# Patient Record
Sex: Male | Born: 1945
Health system: Southern US, Community
[De-identification: ages and names within clinical notes are randomized; demographics above are authoritative.]

## PROBLEM LIST (undated history)

## (undated) DIAGNOSIS — C61 Malignant neoplasm of prostate: Secondary | ICD-10-CM

## (undated) DIAGNOSIS — M2550 Pain in unspecified joint: Secondary | ICD-10-CM

## (undated) DIAGNOSIS — I10 Essential (primary) hypertension: Secondary | ICD-10-CM

---

## 2000-08-08 ENCOUNTER — Ambulatory Visit (HOSPITAL_COMMUNITY): Admission: RE | Admit: 2000-08-08 | Discharge: 2000-08-08 | Payer: Self-pay | Admitting: Internal Medicine

## 2000-08-08 ENCOUNTER — Encounter: Payer: Self-pay | Admitting: Internal Medicine

## 2003-02-13 ENCOUNTER — Ambulatory Visit (HOSPITAL_COMMUNITY): Admission: RE | Admit: 2003-02-13 | Discharge: 2003-02-13 | Payer: Self-pay | Admitting: Gastroenterology

## 2010-09-14 ENCOUNTER — Other Ambulatory Visit: Payer: Self-pay | Admitting: Gastroenterology

## 2011-03-09 ENCOUNTER — Ambulatory Visit
Admission: RE | Admit: 2011-03-09 | Discharge: 2011-03-09 | Disposition: A | Discharge status: Home / Self Care | Payer: BC Managed Care – PPO | Source: Ambulatory Visit | Attending: Radiation Oncology | Admitting: Radiation Oncology

## 2011-03-09 DIAGNOSIS — Z79899 Other long term (current) drug therapy: Secondary | ICD-10-CM | POA: Insufficient documentation

## 2011-03-09 DIAGNOSIS — I10 Essential (primary) hypertension: Secondary | ICD-10-CM | POA: Insufficient documentation

## 2011-03-09 DIAGNOSIS — Z87891 Personal history of nicotine dependence: Secondary | ICD-10-CM | POA: Insufficient documentation

## 2011-03-09 DIAGNOSIS — C61 Malignant neoplasm of prostate: Secondary | ICD-10-CM | POA: Insufficient documentation

## 2011-03-09 DIAGNOSIS — E119 Type 2 diabetes mellitus without complications: Secondary | ICD-10-CM | POA: Insufficient documentation

## 2011-03-09 DIAGNOSIS — N529 Male erectile dysfunction, unspecified: Secondary | ICD-10-CM | POA: Insufficient documentation

## 2011-03-09 DIAGNOSIS — E7800 Pure hypercholesterolemia, unspecified: Secondary | ICD-10-CM | POA: Insufficient documentation

## 2011-03-09 DIAGNOSIS — Z803 Family history of malignant neoplasm of breast: Secondary | ICD-10-CM | POA: Insufficient documentation

## 2011-03-09 DIAGNOSIS — Z808 Family history of malignant neoplasm of other organs or systems: Secondary | ICD-10-CM | POA: Insufficient documentation

## 2011-03-23 ENCOUNTER — Ambulatory Visit (HOSPITAL_BASED_OUTPATIENT_CLINIC_OR_DEPARTMENT_OTHER)
Admission: RE | Admit: 2011-03-23 | Discharge: 2011-03-23 | Disposition: A | Discharge status: Home / Self Care | Payer: BC Managed Care – PPO | Source: Ambulatory Visit | Attending: Urology | Admitting: Urology

## 2011-03-23 ENCOUNTER — Other Ambulatory Visit: Payer: Self-pay | Admitting: Urology

## 2011-03-23 ENCOUNTER — Ambulatory Visit
Admission: RE | Admit: 2011-03-23 | Discharge: 2011-03-23 | Disposition: A | Discharge status: Home / Self Care | Payer: BC Managed Care – PPO | Source: Ambulatory Visit | Attending: Urology | Admitting: Urology

## 2011-03-23 DIAGNOSIS — Z01811 Encounter for preprocedural respiratory examination: Secondary | ICD-10-CM

## 2011-03-23 DIAGNOSIS — C61 Malignant neoplasm of prostate: Secondary | ICD-10-CM | POA: Insufficient documentation

## 2011-03-31 ENCOUNTER — Other Ambulatory Visit: Payer: Self-pay | Admitting: Urology

## 2011-04-07 ENCOUNTER — Telehealth: Payer: Self-pay | Admitting: Radiation Oncology

## 2011-04-07 NOTE — Telephone Encounter (Signed)
No Pre Cert req'd for implant rad tx. I spoke with Mervyn Gay at [facility] - Call id nbr [phone]  Cpt's Prostate Implant [id number]    [phone]    [phone]    [id number] [phone]    [phone]

## 2011-04-12 ENCOUNTER — Encounter (HOSPITAL_BASED_OUTPATIENT_CLINIC_OR_DEPARTMENT_OTHER): Payer: Self-pay | Admitting: *Deleted

## 2011-04-12 LAB — COMPREHENSIVE METABOLIC PANEL
AST: 13 U/L (ref 0–37)
Albumin: 3.7 g/dL (ref 3.5–5.2)
Calcium: 9.8 mg/dL (ref 8.4–10.5)
Chloride: 96 mEq/L (ref 96–112)
Creatinine, Ser: 0.97 mg/dL (ref 0.50–1.35)
Total Bilirubin: 0.3 mg/dL (ref 0.3–1.2)
Total Protein: 6.8 g/dL (ref 6.0–8.3)

## 2011-04-12 LAB — CBC
MCHC: 33 g/dL (ref 30.0–36.0)
MCV: 85.7 fL (ref 78.0–100.0)
Platelets: 290 10*3/uL (ref 150–400)
RDW: 12.9 % (ref 11.5–15.5)
WBC: 7.5 10*3/uL (ref 4.0–10.5)

## 2011-04-12 LAB — PROTIME-INR
INR: 0.9 (ref 0.00–1.49)
Prothrombin Time: 12.3 seconds (ref 11.6–15.2)

## 2011-04-12 LAB — APTT: aPTT: 34 seconds (ref 24–37)

## 2011-04-12 NOTE — Progress Notes (Signed)
NPO AFTER MN. PT TO ARRIVE AT 0815. EKG AND CXR W/ CHART. LABS TO DONE AT [facility] TODAY. WILL DO FLEET ENEMA AM OF SURG.

## 2011-04-13 ENCOUNTER — Telehealth: Payer: Self-pay | Admitting: *Deleted

## 2011-04-14 ENCOUNTER — Encounter (HOSPITAL_BASED_OUTPATIENT_CLINIC_OR_DEPARTMENT_OTHER): Payer: Self-pay | Admitting: Anesthesiology

## 2011-04-14 ENCOUNTER — Encounter: Payer: BC Managed Care – PPO | Admitting: Radiation Oncology

## 2011-04-14 ENCOUNTER — Encounter: Payer: Self-pay | Admitting: Radiation Oncology

## 2011-04-14 ENCOUNTER — Ambulatory Visit (HOSPITAL_BASED_OUTPATIENT_CLINIC_OR_DEPARTMENT_OTHER)
Admission: RE | Admit: 2011-04-14 | Discharge: 2011-04-14 | Disposition: A | Discharge status: Home / Self Care | Payer: BC Managed Care – PPO | Source: Ambulatory Visit | Attending: Urology | Admitting: Urology

## 2011-04-14 ENCOUNTER — Encounter (HOSPITAL_BASED_OUTPATIENT_CLINIC_OR_DEPARTMENT_OTHER)
Admission: RE | Disposition: A | Discharge status: Home / Self Care | Payer: Self-pay | Source: Ambulatory Visit | Attending: Urology

## 2011-04-14 ENCOUNTER — Ambulatory Visit (HOSPITAL_COMMUNITY): Payer: BC Managed Care – PPO | Attending: Urology

## 2011-04-14 DIAGNOSIS — Z79899 Other long term (current) drug therapy: Secondary | ICD-10-CM | POA: Insufficient documentation

## 2011-04-14 DIAGNOSIS — E7800 Pure hypercholesterolemia, unspecified: Secondary | ICD-10-CM | POA: Insufficient documentation

## 2011-04-14 DIAGNOSIS — I10 Essential (primary) hypertension: Secondary | ICD-10-CM | POA: Insufficient documentation

## 2011-04-14 DIAGNOSIS — N433 Hydrocele, unspecified: Secondary | ICD-10-CM | POA: Insufficient documentation

## 2011-04-14 DIAGNOSIS — L909 Atrophic disorder of skin, unspecified: Secondary | ICD-10-CM | POA: Insufficient documentation

## 2011-04-14 DIAGNOSIS — C61 Malignant neoplasm of prostate: Secondary | ICD-10-CM | POA: Insufficient documentation

## 2011-04-14 DIAGNOSIS — L919 Hypertrophic disorder of the skin, unspecified: Secondary | ICD-10-CM | POA: Insufficient documentation

## 2011-04-14 DIAGNOSIS — E119 Type 2 diabetes mellitus without complications: Secondary | ICD-10-CM | POA: Insufficient documentation

## 2011-04-14 DIAGNOSIS — N529 Male erectile dysfunction, unspecified: Secondary | ICD-10-CM | POA: Insufficient documentation

## 2011-04-14 DIAGNOSIS — N400 Enlarged prostate without lower urinary tract symptoms: Secondary | ICD-10-CM | POA: Insufficient documentation

## 2011-04-14 HISTORY — DX: Essential (primary) hypertension: I10

## 2011-04-14 HISTORY — PX: RADIOACTIVE SEED IMPLANT: SHX5150

## 2011-04-14 HISTORY — PX: HYDROCELE EXCISION: SHX482

## 2011-04-14 HISTORY — DX: Pain in unspecified joint: M25.50

## 2011-04-14 HISTORY — DX: Malignant neoplasm of prostate: C61

## 2011-04-14 LAB — GLUCOSE, CAPILLARY
Glucose-Capillary: 146 mg/dL — ABNORMAL HIGH (ref 70–99)
Glucose-Capillary: 126 mg/dL — ABNORMAL HIGH (ref 70–99)

## 2011-04-14 SURGERY — INSERTION, RADIATION SOURCE, PROSTATE
Anesthesia: General | Site: Scrotum | Wound class: Clean Contaminated

## 2011-04-14 MED ORDER — ONDANSETRON HCL 4 MG/2ML IJ SOLN
INTRAMUSCULAR | Status: DC | PRN
  Administered 2011-04-14: 4 mg via INTRAVENOUS

## 2011-04-14 MED ORDER — LIDOCAINE HCL 2 % EX GEL
CUTANEOUS | Status: DC | PRN
  Administered 2011-04-14: 1

## 2011-04-14 MED ORDER — STERILE WATER FOR IRRIGATION IR SOLN
Status: DC | PRN
  Administered 2011-04-14: 3000 mL

## 2011-04-14 MED ORDER — HYDROCODONE-ACETAMINOPHEN 5-325 MG PO TABS: 1.0000 | ORAL_TABLET | Freq: Four times a day (QID) | ORAL | Status: DC | PRN

## 2011-04-14 MED ORDER — FENTANYL CITRATE 0.05 MG/ML IJ SOLN
INTRAMUSCULAR | Status: DC | PRN
  Administered 2011-04-14 (×2): 25 ug via INTRAVENOUS
  Administered 2011-04-14: 50 ug via INTRAVENOUS
  Administered 2011-04-14 (×5): 25 ug via INTRAVENOUS
  Administered 2011-04-14: 50 ug via INTRAVENOUS
  Administered 2011-04-14: 25 ug via INTRAVENOUS

## 2011-04-14 MED ORDER — LACTATED RINGERS IV SOLN
INTRAVENOUS | Status: DC
  Administered 2011-04-14 (×3): via INTRAVENOUS

## 2011-04-14 MED ORDER — LIDOCAINE HCL (CARDIAC) 20 MG/ML IV SOLN
INTRAVENOUS | Status: DC | PRN
  Administered 2011-04-14: 60 mg via INTRAVENOUS

## 2011-04-14 MED ORDER — IOHEXOL 350 MG/ML SOLN
INTRAVENOUS | Status: DC | PRN
  Administered 2011-04-14: 10 mL

## 2011-04-14 MED ORDER — MIDAZOLAM HCL 5 MG/5ML IJ SOLN
INTRAMUSCULAR | Status: DC | PRN
  Administered 2011-04-14 (×2): 1 mg via INTRAVENOUS

## 2011-04-14 MED ORDER — CIPROFLOXACIN IN D5W 400 MG/200ML IV SOLN
400.0000 mg | INTRAVENOUS | Status: AC
  Administered 2011-04-14: 400 mg via INTRAVENOUS

## 2011-04-14 MED ORDER — HYDROCODONE-ACETAMINOPHEN 5-325 MG PO TABS: 1.0000 | ORAL_TABLET | Freq: Four times a day (QID) | ORAL | Status: AC | PRN

## 2011-04-14 MED ORDER — BUPIVACAINE HCL (PF) 0.25 % IJ SOLN
INTRAMUSCULAR | Status: DC | PRN
  Administered 2011-04-14: 8 mL

## 2011-04-14 MED ORDER — CIPROFLOXACIN HCL 500 MG PO TABS: 500.0000 mg | ORAL_TABLET | Freq: Two times a day (BID) | ORAL | Status: AC

## 2011-04-14 MED ORDER — PROPOFOL 10 MG/ML IV EMUL
INTRAVENOUS | Status: DC | PRN
  Administered 2011-04-14: 20 mg via INTRAVENOUS
  Administered 2011-04-14: 200 mg via INTRAVENOUS

## 2011-04-14 MED ORDER — SODIUM CHLORIDE 0.9 % IR SOLN
Status: DC | PRN
  Administered 2011-04-14: 1

## 2011-04-14 SURGICAL SUPPLY — 56 items
BAG URINE DRAINAGE (UROLOGICAL SUPPLIES) ×3 IMPLANT
BANDAGE GAUZE ELAST BULKY 4 IN (GAUZE/BANDAGES/DRESSINGS) ×3 IMPLANT
BENZOIN TINCTURE PRP APPL 2/3 (GAUZE/BANDAGES/DRESSINGS) ×3 IMPLANT
BLADE SURG 10 STRL SS (BLADE) IMPLANT
BLADE SURG 15 STRL LF DISP TIS (BLADE) ×2 IMPLANT
BLADE SURG 15 STRL SS (BLADE) ×1
BLADE SURG ROTATE 9660 (MISCELLANEOUS) ×3 IMPLANT
CANISTER SUCTION 1200CC (MISCELLANEOUS) IMPLANT
CANISTER SUCTION 2500CC (MISCELLANEOUS) ×3 IMPLANT
CATH FOLEY 2WAY SLVR  5CC 16FR (CATHETERS) ×3
CATH FOLEY 2WAY SLVR 5CC 16FR (CATHETERS) ×6 IMPLANT
CATH ROBINSON RED A/P 20FR (CATHETERS) ×3 IMPLANT
CLEANER CAUTERY TIP 5X5 PAD (MISCELLANEOUS) ×2 IMPLANT
CLOTH BEACON ORANGE TIMEOUT ST (SAFETY) ×3 IMPLANT
COVER MAYO STAND STRL (DRAPES) ×6 IMPLANT
COVER TABLE BACK 60X90 (DRAPES) ×6 IMPLANT
DRAIN PENROSE 18X1/4 LTX STRL (WOUND CARE) IMPLANT
DRAPE PED LAPAROTOMY (DRAPES) IMPLANT
DRAPE UNDERBUTTOCKS STRL (DRAPE) ×3 IMPLANT
DRSG TEGADERM 2-3/8X2-3/4 SM (GAUZE/BANDAGES/DRESSINGS) IMPLANT
DRSG TEGADERM 4X4.75 (GAUZE/BANDAGES/DRESSINGS) ×12 IMPLANT
DRSG TEGADERM 8X12 (GAUZE/BANDAGES/DRESSINGS) ×3 IMPLANT
ELECT NEEDLE TIP 2.8 STRL (NEEDLE) ×3 IMPLANT
ELECT REM PT RETURN 9FT ADLT (ELECTROSURGICAL) ×3
ELECTRODE REM PT RTRN 9FT ADLT (ELECTROSURGICAL) ×2 IMPLANT
GLOVE BIO SURGEON STRL SZ7.5 (GLOVE) ×24 IMPLANT
GLOVE ECLIPSE 6.5 STRL STRAW (GLOVE) ×9 IMPLANT
GLOVE ECLIPSE 8.0 STRL XLNG CF (GLOVE) IMPLANT
GLOVE INDICATOR 6.5 STRL GRN (GLOVE) ×9 IMPLANT
GOWN STRL NON-REIN LRG LVL3 (GOWN DISPOSABLE) ×12 IMPLANT
GOWN STRL REIN XL XLG (GOWN DISPOSABLE) ×6 IMPLANT
NEEDLE HYPO 25X1 1.5 SAFETY (NEEDLE) ×3 IMPLANT
NS IRRIG 500ML POUR BTL (IV SOLUTION) ×3 IMPLANT
PACK BASIN DAY SURGERY FS (CUSTOM PROCEDURE TRAY) ×3 IMPLANT
PACK CYSTOSCOPY (CUSTOM PROCEDURE TRAY) ×6 IMPLANT
PAD CLEANER CAUTERY TIP 5X5 (MISCELLANEOUS) ×1
PENCIL BUTTON HOLSTER BLD 10FT (ELECTRODE) ×3 IMPLANT
SUPPORT SCROTAL LG STRP (MISCELLANEOUS) ×3 IMPLANT
SUT SILK 3 0 SH 30 (SUTURE) IMPLANT
SUT VIC AB 3-0 SH 27 (SUTURE) ×2
SUT VIC AB 3-0 SH 27X BRD (SUTURE) ×4 IMPLANT
SUT VIC AB 4-0 SH 27 (SUTURE)
SUT VIC AB 4-0 SH 27XANBCTRL (SUTURE) IMPLANT
SUT VIC AB 5-0 P-3 18X BRD (SUTURE) IMPLANT
SUT VIC AB 5-0 P3 18 (SUTURE)
SUT VICRYL 4-0 PS2 18IN ABS (SUTURE) ×3 IMPLANT
SYR CONTROL 10ML LL (SYRINGE) ×3 IMPLANT
SYRINGE 10CC LL (SYRINGE) ×6 IMPLANT
TOWEL OR 17X24 6PK STRL BLUE (TOWEL DISPOSABLE) ×15 IMPLANT
TRAY DSU PREP LF (CUSTOM PROCEDURE TRAY) ×3 IMPLANT
TUBE CONNECTING 12X1/4 (SUCTIONS) ×3 IMPLANT
UNDERPAD 30X30 INCONTINENT (UNDERPADS AND DIAPERS) ×6 IMPLANT
WATER STERILE IRR 3000ML UROMA (IV SOLUTION) ×3 IMPLANT
WATER STERILE IRR 500ML POUR (IV SOLUTION) ×3 IMPLANT
YANKAUER SUCT BULB TIP NO VENT (SUCTIONS) ×3 IMPLANT
radioactive seed implant ×3 IMPLANT

## 2011-04-14 NOTE — H&P (Signed)
Christopher Barnett presents today for seed implant and hydrocele repair. He was recently with diagnosed favorable clinical stage T1c prostate cancer. Christopher Barnett's PSA was minimally elevated at 4.1. The digital rectal exam was unremarkable.   Prostate ultrasound revealed a gland that was approximately 44 grams. No other worrisome features on the ultrasound.   The patient's biopsies were unfortunately positive. The patient had 3 out of 12 biopsies positive to Gleason 3 + 3 + 6 adenocarcinoma of the prostate with each core involvement at 5% to 10%. He had an additional 3 cores that did show some high-grade PIN/atypia. No obvious complications from the biopsy. Admittedly, this has been difficult for him emotionally. Again, as we alluded to on his last visit, we felt that robotic surgery might be overaggressive, given his situation. We thought that ultimately he would be best served with either careful active surveillance or seed implantation. He has done a lot of thinking about this and is interested in seed implantation. Today his AUA symptom score is 5 with a bothersome score of 2. This is on no therapy for voiding dysfunction, benign prostatic hyperplasia, etc. Preoperative sexual functioning score is 23/25. Again, Christopher Barnett had originally presented to see me with left-sided scrotal swelling and was diagnosed on ultrasound with a fairly large unilocular hydrocele. He does request that if seed implantation is done, that he would like to have the hydrocele repaired under the same anesthetic if we felt that was reasonable/prudent.    Past Medical History Problems  1. History of  Diabetes Mellitus 250.00 2. History of  Hypercholesterolemia 272.0 3. History of  Hypertension 401.9  Current Meds 1. Actos 15 MG Oral Tablet; 1 tablet in the morning; 2 tablets in the evening; Therapy:  (Recorded:02Aug2012) to 2. Amlodipine Besy-Benazepril HCl 5-20 MG Oral Capsule; 2 capsules 1 time daily; Therapy:  (Recorded:02Aug2012) to 3.  Hydrochlorothiazide 25 MG Oral Tablet; 1 per day; Therapy: (Recorded:02Aug2012) to 4. Lipitor 20 MG Oral Tablet; 1 per day; Therapy: (Recorded:02Aug2012) to 5. MetFORMIN HCl 850 MG Oral Tablet; 1 tablet in the morning; 2 tablets in the evening; Therapy:  (Recorded:02Aug2012) to  Allergies Medication  1. Penicillins  Family History Problems  1. Fraternal history of  Acute Myocardial Infarction V17.3 2. Paternal history of  Aortic Aneurysm 3. Maternal history of  Breast Cancer V16.3 4. Fraternal history of  Diabetes Mellitus V18.0 5. Maternal history of  Diabetes Mellitus V18.0 6. Family history of  Family Health Status Number Of Children 2 sons 7. Family history of  Father Deceased At Age ____ 76 / Aortic Aneurysm 8. Fraternal history of  Kidney Cancer V16.51 9. Family history of  Mother Deceased At Age ____ 3 / Breast Cancer 10. Fraternal history of  Prostate Cancer V16.42  Social History Problems    Caffeine Use 5-6 per day   Former Smoker V15.82 2 packs per day; smoked for 15 years; Quit smoking approximately 30 years ago; denies any other forms of tobacco use.   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  Genitourinary: nocturia, erectile dysfunction, testicular mass, scrotal swelling and scrotal mass, but no testicular pain.  Hematologic/Lymphatic: a tendency to easily bruise.  Cardiovascular: leg swelling.  Musculoskeletal: back pain.    Vitals Vital Signs [Data Includes: Last 1 Day]  02Aug2012 08:47AM  BMI Calculated: 33.86 BSA Calculated: 2.34 Height: 6 ft  Weight: 250 lb  Blood Pressure: 132 / 77, LUE, Sitting Temperature: 97.7 F,  Oral Heart Rate: 97  Physical Exam Constitutional: Well nourished and well developed . No acute distress.  ENT:. The ears and nose are normal in appearance.  Neck: The appearance of the neck is normal and no neck mass is present.  Pulmonary: No respiratory distress and normal respiratory rhythm and effort.  Cardiovascular: Heart rate and rhythm are normal . No peripheral edema.  Abdomen: The abdomen is soft and nontender. No masses are palpated. No CVA tenderness. No hernias are palpable. No hepatosplenomegaly noted.  Rectal: Rectal exam demonstrates normal sphincter tone, no tenderness and no masses. Estimated prostate size is 1+. The prostate has no nodularity and is not tender. The left seminal vesicle is nonpalpable. The right seminal vesicle is nonpalpable. The perineum is normal on inspection.  Genitourinary: Examination of the left scrotum demostrates a hydrocele. The right testis is palpably normal. The left testis is nonpalpable.  Skin: Normal skin turgor, no visible rash and no visible skin lesions.  Neuro/Psych:. Mood and affect are appropriate.    Results/Data Urine [Data Includes: Last 1 Day]  02Aug2012  COLOR: YELLOW  Reference Range YELLOW APPEARANCE: CLEAR  Reference Range CLEAR SPECIFIC GRAVITY: 1.020  Reference Range 1.005-1.030 pH: 5.5  Reference Range 5.0-8.0 GLUCOSE: NEG mg/dL Reference Range NEG BILIRUBIN: NEG  Reference Range NEG KETONE: NEG mg/dL Reference Range NEG BLOOD: NEG  Reference Range NEG PROTEIN: NEG mg/dL Reference Range NEG UROBILINOGEN: 0.2 mg/dL Reference Range 4.0-9.8 NITRITE: NEG  Reference Range NEG LEUKOCYTE ESTERASE: NEG  Reference Range NEG   Mr. Fortin underwent scrotal ultrasonography today. The full report with testicular measurements are included on the worksheet. The bottom line is that the underlying testes on both sides looked unremarkable. There were some small epididymal cysts but the major problem was what appeared to be a large  unilocular hydrocele around the left testicle. No other complicated features or issues noted.    Assessment Assessed  1. Benign Prostatic Hypertrophy Without Urinary Obstruction 600.00 2. 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 Testicular Hydrocele 603.9 4. Prostate cancer  PLAN    1. Seed implantation   2. Hydrocele repair.

## 2011-04-14 NOTE — Anesthesia Postprocedure Evaluation (Signed)
  Anesthesia Post-op Note  Patient: Christopher Barnett  Procedure(s) Performed:  RADIOACTIVE SEED IMPLANT; HYDROCELECTOMY ADULT  Patient Location: PACU  Anesthesia Type: General  Level of Consciousness: awake and alert   Airway and Oxygen Therapy: Patient Spontanous Breathing  Post-op Pain: mild  Post-op Assessment: Post-op Vital signs reviewed, Patient's Cardiovascular Status Stable, Respiratory Function Stable, Patent Airway and No signs of Nausea or vomiting  Post-op Vital Signs: stable  Complications: No apparent anesthesia complications  

## 2011-04-14 NOTE — Progress Notes (Signed)
[facility] Radiation Oncology Brachytherapy Operative Procedure Note  Name: Christopher Barnett MRN: [id number]  Date: 04/14/2011  DOB: 04-May-1946  Status:outpatient   DIAGNOSIS: A 65 year old gentlemen with stage T1c adenocarcinoma of the prostate with a Gleason of 6 and a PSA of 4.1.  PROCEDURE: Insertion of radioactive I-125 seeds into the prostate gland.  RADIATION DOSE: 14500 Gy, definitive therapy.  TECHNIQUE: Christopher Barnett was brought to the operating room with Dr. Isabel Caprice. He was placed in the dorsolithotomy position. He was catheterized and a rectal tube was inserted. The perineum was shaved, prepped and draped. The ultrasound probe was then introduced into the rectum to see the prostate gland.  TREATMENT DEVICE: A needle grid was attached to the ultrasound probe stand and anchor needles were placed.  COMPLEX ISODOSE CALCULATION: The prostate was imaged in 3D using a sagittal sweep of the prostate probe. These images were transferred to the planning computer. There, the prostate, urethra and rectum were defined on each axial reconstructed image. Then, the software created an optimized plan and a few seed positions were adjusted. Then the accepted plan was uploaded to the seed Selectron afterloading unit.  SPECIAL TREATMENT PROCEDURE/SUPERVISION AND HANDLING: The Nucletron FIRST system was used to place the needles under sagittal guidance. A total of 24 needles were used to deposit 72 seeds in the prostate gland. The individual seed activity was 0.432 mCi for a total implant activity of 31.1 mCi.  COMPLEX SIMULATION: At the end of the procedure, an anterior radiograph of the pelvis was obtained to document seed positioning and count. Cystoscopy was performed to check the urethra and bladder.  MICRODOSIMETRY: At the end of the procedure, the patient was emitting 0.266 mrem/hr at 1 meter. Accordingly, he was considered safe for hospital discharge.  PLAN: The patient will  return to the radiation oncology clinic for post implant CT dosimetry in three weeks.

## 2011-04-14 NOTE — Progress Notes (Signed)
End of treatment summary:  Diagnosis stage TI C. stable risk adenocarcinoma of the prostate Requesting physician: Dr. Barron Alvine Intent: Curative Date of implant 04/14/2011 Site/dose: Prostate 14,500 cGy, isotope iodine 125 utilizing 72 seeds and 24 needles Individual seed activity 0.43 to millicuries per seed for a total implant activity of 31.1 mCi.  Narrative: Christopher Barnett appears to have undergone a successful robotic seed implant with Dr. Isabel Caprice.  Plan: Christopher Barnett will return for followup visits to see both of [place] in approximately 3 weeks. We will also obtain a CT scan at that time to assess the quality of his implant.

## 2011-04-14 NOTE — Anesthesia Procedure Notes (Addendum)
Procedure Name: LMA Insertion Date/Time: 04/14/2011 9:38 AM Performed by: Lorrin Jackson Pre-anesthesia Checklist: Patient identified, Emergency Drugs available, Suction available and Patient being monitored Patient Re-evaluated:Patient Re-evaluated prior to inductionOxygen Delivery Method: Circle System Utilized Preoxygenation: Pre-oxygenation with 100% oxygen Intubation Type: IV induction Ventilation: Mask ventilation without difficulty LMA: LMA with gastric port inserted LMA Size: 4.0 Number of attempts: 1 Placement Confirmation: positive ETCO2 and breath sounds checked- equal and bilateral Tube secured with: Tape Dental Injury: Teeth and Oropharynx as per pre-operative assessment       Narrative:

## 2011-04-14 NOTE — Anesthesia Preprocedure Evaluation (Addendum)
Anesthesia Evaluation  Patient identified by MRN, date of birth, ID band Patient awake    Reviewed: Allergy & Precautions, H&P , NPO status , Patient's Chart, lab work & pertinent test results  Airway Mallampati: II TM Distance: >3 FB Neck ROM: Full    Dental No notable dental hx.    Pulmonary neg pulmonary ROS,  clear to auscultation  Pulmonary exam normal       Cardiovascular hypertension, neg cardio ROS Regular Normal    Neuro/Psych Negative Neurological ROS  Negative Psych ROS   GI/Hepatic negative GI ROS, Neg liver ROS,   Endo/Other  Negative Endocrine ROSDiabetes mellitus-, Oral Hypoglycemic Agents  Renal/GU negative Renal ROS  Genitourinary negative   Musculoskeletal negative musculoskeletal ROS (+)   Abdominal   Peds negative pediatric ROS (+)  Hematology negative hematology ROS (+)   Anesthesia Other Findings   Reproductive/Obstetrics negative OB ROS                          Anesthesia Physical Anesthesia Plan  ASA: II  Anesthesia Plan: General   Post-op Pain Management:    Induction: Intravenous  Airway Management Planned: LMA  Additional Equipment:   Intra-op Plan:   Post-operative Plan: Extubation in OR  Informed Consent: I have reviewed the patients History and Physical, chart, labs and discussed the procedure including the risks, benefits and alternatives for the proposed anesthesia with the patient or authorized representative who has indicated his/her understanding and acceptance.   Dental advisory given  Plan Discussed with: CRNA  Anesthesia Plan Comments:        Anesthesia Quick Evaluation

## 2011-04-14 NOTE — Op Note (Signed)
Preoperative diagnosis: #1 clinical stage TI C. adenocarcinoma prostate, #2 left hydrocele, #3 scrotal skin tag Postoperative diagnosis: Same Procedure: Prostate seed implantation with Nucleotron implanter, removal of scrotal skin tag and left hydrocele repair  Surgeon: Valetta Fuller M.D. Anesthesia: Gen.  Indications: Patient is 65 years of age and was diagnosed with favorable clinical stage TI C. adenocarcinoma the prostate. He underwent extensive discussion about treatment options and chose to undergo brachii therapy. The patient also had a symptomatic left hydrocele that he requested to be repaired. He also asked for a concurrent skin tags be removed off the scrotum. The patient appeared to understand the risks and benefits of all of these procedures as well as the likelihood of improvement/success. A full informed consent has been obtained. The patient was done the preparatory steps for the surgery. He had placement of PAS compression boots has received perioperative antibiotics.  Technique and findings: The patient was brought to the operating room where he had successful induction of general anesthesia. Appropriate surgical timeout was performed. He was placed in lithotomy position and prepped and draped in the usual manner. A Foley catheter with contrast in the balloon was placed. Radiation oncology was in charge of his case initially. They placed a transrectal ultrasound probe which was anchored to the floor stand. Anchoring needles were placed within the prostate. Real-time ultrasonography was used to perform contouring of the prostate, urethra and rectum. The dosing parameters were then established by the radiation oncology department. The needles were then placed in the prostate utilizing real-time transrectal ultrasound guidance. A total of 26 needles were utilized. See implantation was done via the robotic implanter. A total of 72 seeds were implanted with the reference dose of 145 gray. The  distribution of seeds via fluoroscopy appeared excellent completion of the procedure. The Foley catheter was removed and flexible cystoscopy was performed. There was no evidence of any seeds within the prostatic urethra bladder. Lidocaine jelly was instilled per urethra new catheter placed.  Attention was then turned towards the scrotal procedure. The patient was In lithotomy position completely reprepped and draped. A second surgical timeout was then performed. A small scrotal skin tag was taken off with electrocautery and did not require any suturing. A median referring incision was made in the scrotum. The hydrocele compartment was opened and approximately 300 cc of clear straw-colored fluid was obtained. Careful examination of the testicle and adnexal structures revealed no other pathology. The hydrocele itself was unilocular and the hydrocele sac fairly thin-walled. The hydrocele was reefed utilizing 3-0 Vicryl suture. A Marcaine spermatic cord block was performed. The testicle was returned to the left hemiscrotum taking great care not to in any way twist the spermatic cord. The scrotum was closed with multiple layers of Vicryl suture. All sponge and needle counts were correct. Blood loss was minimal. There were no obvious complications and the patient was brought to recovery room in stable condition.

## 2011-04-14 NOTE — Transfer of Care (Signed)
Immediate Anesthesia Transfer of Care Note  Patient: Christopher Barnett  Procedure(s) Performed:  RADIOACTIVE SEED IMPLANT; HYDROCELECTOMY ADULT  Patient Location: PACU  Anesthesia Type: General  Level of Consciousness: sedated  Airway & Oxygen Therapy: Patient Spontanous Breathing and Patient connected to face mask oxygen  Post-op Assessment: Report given to PACU RN  Post vital signs: Reviewed and stable  Complications: No apparent anesthesia complications

## 2011-04-14 NOTE — Anesthesia Postprocedure Evaluation (Signed)
  Anesthesia Post-op Note  Patient: Christopher Barnett  Procedure(s) Performed:  RADIOACTIVE SEED IMPLANT; HYDROCELECTOMY ADULT  Patient Location: PACU  Anesthesia Type: General  Level of Consciousness: awake and alert   Airway and Oxygen Therapy: Patient Spontanous Breathing  Post-op Pain: mild  Post-op Assessment: Post-op Vital signs reviewed, Patient's Cardiovascular Status Stable, Respiratory Function Stable, Patent Airway and No signs of Nausea or vomiting  Post-op Vital Signs: stable  Complications: No apparent anesthesia complications

## 2011-04-14 NOTE — Interval H&P Note (Signed)
History and Physical Interval Note:   04/14/2011   8:21 AM   Christopher Barnett  has presented today for surgery, with the diagnosis of PROSTATE CANCER HYDROCELE  The various methods of treatment have been discussed with the patient and family. After consideration of risks, benefits and other options for treatment, the patient has consented to  Procedure(s): RADIOACTIVE SEED IMPLANT HYDROCELECTOMY ADULT as a surgical intervention .  The patients' history has been reviewed, patient examined, no change in status, stable for surgery.  I have reviewed the patients' chart and labs.  Questions were answered to the patient's satisfaction.     Valetta Fuller  MD

## 2011-04-21 ENCOUNTER — Encounter (HOSPITAL_BASED_OUTPATIENT_CLINIC_OR_DEPARTMENT_OTHER): Payer: Self-pay | Admitting: Urology

## 2011-05-03 ENCOUNTER — Telehealth: Payer: Self-pay | Admitting: *Deleted

## 2011-05-04 ENCOUNTER — Encounter: Payer: Self-pay | Admitting: Radiation Oncology

## 2011-05-04 ENCOUNTER — Ambulatory Visit
Admit: 2011-05-04 | Discharge: 2011-05-04 | Disposition: A | Discharge status: Home / Self Care | Payer: BC Managed Care – PPO | Attending: Radiation Oncology | Admitting: Radiation Oncology

## 2011-05-04 ENCOUNTER — Ambulatory Visit: Payer: BC Managed Care – PPO

## 2011-05-04 ENCOUNTER — Ambulatory Visit
Admission: RE | Admit: 2011-05-04 | Discharge: 2011-05-04 | Disposition: A | Discharge status: Home / Self Care | Payer: BC Managed Care – PPO | Source: Ambulatory Visit | Attending: Radiation Oncology | Admitting: Radiation Oncology

## 2011-05-04 VITALS — BP 115/75 | HR 90 | Temp 98.1°F | Resp 18 | Wt 260.6 lb

## 2011-05-04 DIAGNOSIS — C61 Malignant neoplasm of prostate: Secondary | ICD-10-CM

## 2011-05-04 NOTE — Progress Notes (Signed)
Simulation note: The patient was taken to the CT simulator. His pelvis was scanned. The location of the iodine 125 seeds were noted. The CT data were transferred to the [facility] treatment planning system for contouring the prostate and rectum. I requesting does find histograms for the prostate, and rectum.

## 2011-05-04 NOTE — Progress Notes (Signed)
Followup note:  The patient returns today approximately 3 weeks following his prostate seed implant with Dr. Isabel Caprice in the management of his stage TI C. favorable risk adenocarcinoma prostate he still Dr. Isabel Caprice earlier today and he was placed on Flomax for his urinary frequency. No GI difficulties.  He is not examined today. His CT scan today shows an excellent seed distribution and I would expect him to have excellent post implant dosimetry.  Impression: Satisfactory progress although he does have what sounds like obstructive symptoms for which Dr. Isabel Caprice has started Flomax.  Plan: We'll move ahead with his post implant dosimetry and for the results Dr. Isabel Caprice. I understand he'll see Dr. Isabel Caprice in March, at which time he will have a PSA determination. I've not scheduled the patient for a formal followup visit, and I asked Dr. Isabel Caprice keep me posted on his progress.

## 2011-05-04 NOTE — Progress Notes (Signed)
HAD SOME BURNING WITH URINATION, DR. Isabel Caprice CALLED RX FOR THIS.  HAS SOME BACK PAIN , RATES 2/10, DOESN'T TAKE ANYTHING FOR THIS

## 2011-05-06 ENCOUNTER — Ambulatory Visit
Admission: RE | Admit: 2011-05-06 | Discharge: 2011-05-06 | Disposition: A | Discharge status: Home / Self Care | Payer: BC Managed Care – PPO | Source: Ambulatory Visit | Attending: Radiation Oncology | Admitting: Radiation Oncology

## 2011-05-06 DIAGNOSIS — C61 Malignant neoplasm of prostate: Secondary | ICD-10-CM | POA: Insufficient documentation

## 2011-05-07 NOTE — Procedures (Signed)
CC:   Valetta Fuller, MD Larina Earthly, M.D.  Mr. Christopher Barnett underwent post implant CT dosimetry/3D simulation today to assess the quality of his prostate seed implant.  His intraoperative prostate volume by ultrasound was 38 mL and his postoperative prostate volume by CT was 36.6 mL, an excellent correlation.  His prostate D90 is 116.34% and V100 is 96.52%.  Zero cc of rectum received the prescribed dose of 14,500 cGy.  In summary, Mr. Christopher Barnett has excellent post implant asymmetry with a low risk for late rectal toxicity.    ______________________________ Maryln Gottron, M.D. RJM/MEDQ  D:  05/07/2011  T:  05/07/2011  Job:  [id number]

## 2011-05-26 ENCOUNTER — Telehealth: Payer: Self-pay | Admitting: Radiation Oncology

## 2011-05-26 NOTE — Telephone Encounter (Signed)
Kevin,son, phoned very concerned about his 75 month old son exposure to radiation. Caryn Bee was of the understanding there were no contraindications to his son being around his father who had iodine 125 seed implants done the Wednesday prior to Thanksgiving. Caryn Bee and his wife came across literature over the weekend that read different then, their pervious understanding. Caryn Bee is so concerned about his son's exposure he has even call his pediatrician. Informed Dr. Kathyrn Lass of this finding and requested his assistance. Dr. Kathyrn Lass expressed he would contact Caryn Bee directly in an attempt to relieve Kevin's anxiety. Provided Dr. Ander Slade with Kevin's contact information via e-mail. Also, copied Dr. Dayton Scrape so that all are aware. Call Caryn Bee back and instructed him to be on the look out for Dr. Caprice Renshaw call. Caryn Bee expressed gratitude.

## 2011-06-01 ENCOUNTER — Ambulatory Visit
Admission: RE | Admit: 2011-06-01 | Payer: BC Managed Care – PPO | Source: Ambulatory Visit | Admitting: Radiation Oncology

## 2011-07-16 NOTE — Telephone Encounter (Signed)
xxx

## 2012-07-19 ENCOUNTER — Ambulatory Visit
Admission: RE | Admit: 2012-07-19 | Discharge: 2012-07-19 | Disposition: A | Discharge status: Home / Self Care | Payer: BC Managed Care – PPO | Source: Ambulatory Visit | Attending: Internal Medicine | Admitting: Internal Medicine

## 2012-07-19 ENCOUNTER — Other Ambulatory Visit: Payer: Self-pay | Admitting: Internal Medicine

## 2012-07-19 DIAGNOSIS — S39012A Strain of muscle, fascia and tendon of lower back, initial encounter: Secondary | ICD-10-CM

## 2012-10-18 IMAGING — CR DG CHEST 2V
2 series · 2 of 2 positions shown · non-contrast
Comparison: None

CLINICAL DATA: Preop chest exam

CHEST - 2 VIEW

[view not recorded (1 of 2)]
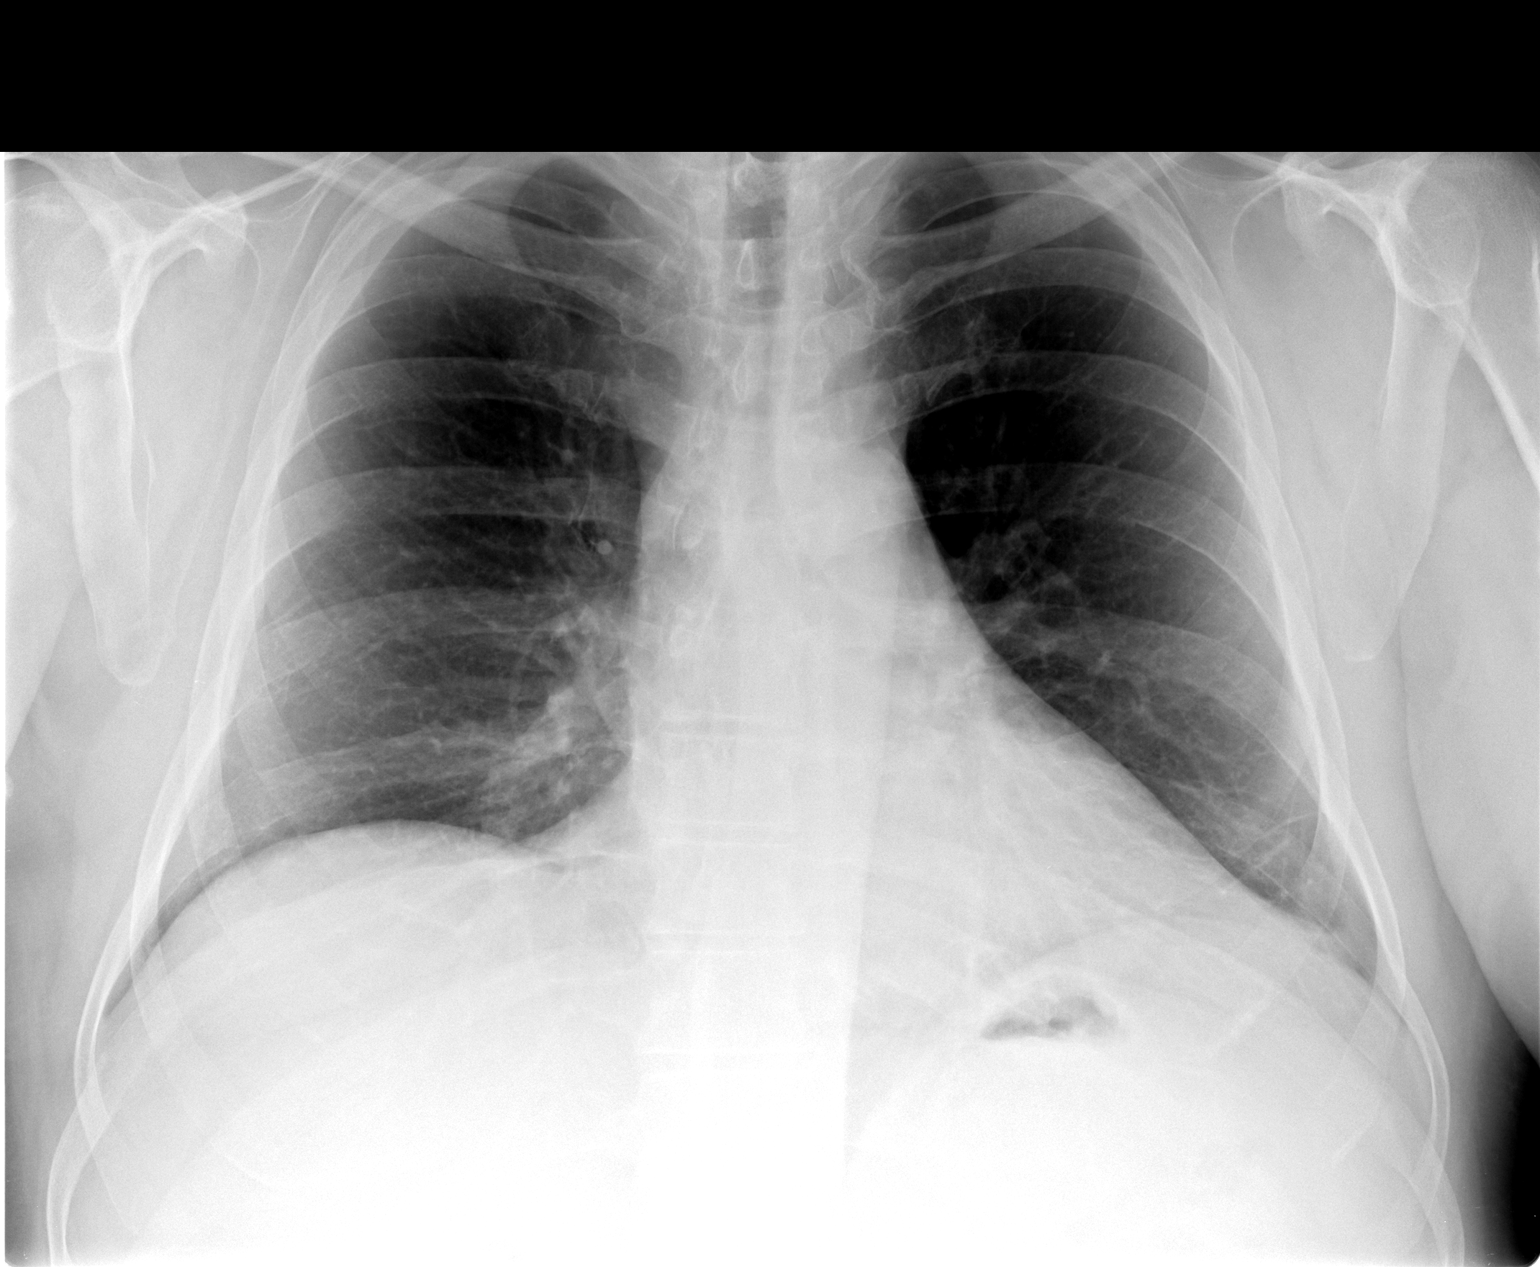

[view not recorded (2 of 2)]
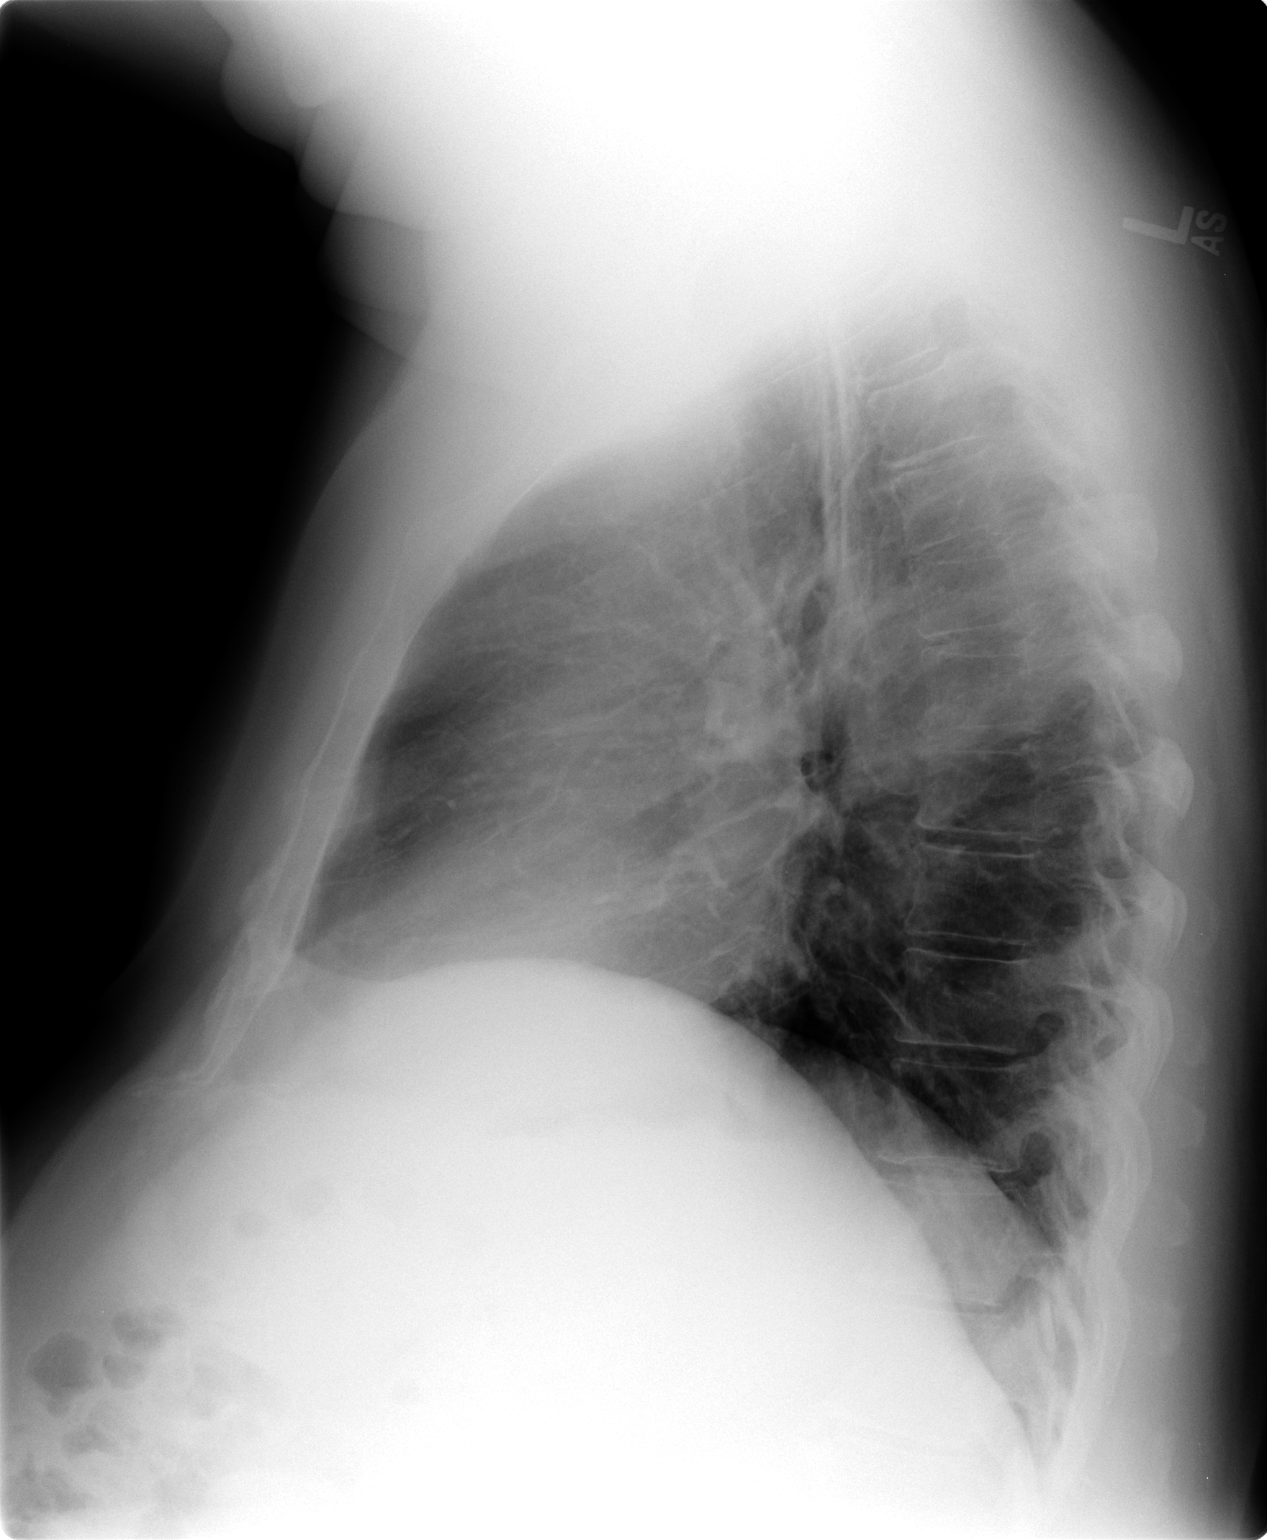

[2 of 2 positions shown; findings below may reference images not displayed]

FINDINGS: The heart size appears normal.

No pleural effusion or pulmonary edema identified.

No airspace consolidation identified.

Scar like opacities noted in the left base.
IMPRESSION: 1.  Left base scarring.

## 2014-03-04 DIAGNOSIS — I10 Essential (primary) hypertension: Secondary | ICD-10-CM | POA: Diagnosis not present

## 2014-03-04 DIAGNOSIS — E119 Type 2 diabetes mellitus without complications: Secondary | ICD-10-CM | POA: Diagnosis not present

## 2014-03-04 DIAGNOSIS — Z6832 Body mass index (BMI) 32.0-32.9, adult: Secondary | ICD-10-CM | POA: Diagnosis not present

## 2014-03-04 DIAGNOSIS — E785 Hyperlipidemia, unspecified: Secondary | ICD-10-CM | POA: Diagnosis not present

## 2014-03-04 DIAGNOSIS — Z23 Encounter for immunization: Secondary | ICD-10-CM | POA: Diagnosis not present

## 2014-04-26 ENCOUNTER — Other Ambulatory Visit: Payer: Self-pay

## 2014-04-26 DIAGNOSIS — L814 Other melanin hyperpigmentation: Secondary | ICD-10-CM | POA: Diagnosis not present

## 2014-04-26 DIAGNOSIS — Z85828 Personal history of other malignant neoplasm of skin: Secondary | ICD-10-CM | POA: Diagnosis not present

## 2014-04-26 DIAGNOSIS — D2271 Melanocytic nevi of right lower limb, including hip: Secondary | ICD-10-CM | POA: Diagnosis not present

## 2014-04-26 DIAGNOSIS — D225 Melanocytic nevi of trunk: Secondary | ICD-10-CM | POA: Diagnosis not present

## 2014-04-26 DIAGNOSIS — D485 Neoplasm of uncertain behavior of skin: Secondary | ICD-10-CM | POA: Diagnosis not present

## 2014-06-21 DIAGNOSIS — Z8546 Personal history of malignant neoplasm of prostate: Secondary | ICD-10-CM | POA: Diagnosis not present

## 2014-07-24 DIAGNOSIS — Z125 Encounter for screening for malignant neoplasm of prostate: Secondary | ICD-10-CM | POA: Diagnosis not present

## 2014-07-24 DIAGNOSIS — I10 Essential (primary) hypertension: Secondary | ICD-10-CM | POA: Diagnosis not present

## 2014-07-24 DIAGNOSIS — E1165 Type 2 diabetes mellitus with hyperglycemia: Secondary | ICD-10-CM | POA: Diagnosis not present

## 2014-07-24 DIAGNOSIS — E785 Hyperlipidemia, unspecified: Secondary | ICD-10-CM | POA: Diagnosis not present

## 2014-07-31 DIAGNOSIS — E785 Hyperlipidemia, unspecified: Secondary | ICD-10-CM | POA: Diagnosis not present

## 2014-07-31 DIAGNOSIS — E669 Obesity, unspecified: Secondary | ICD-10-CM | POA: Diagnosis not present

## 2014-07-31 DIAGNOSIS — M1990 Unspecified osteoarthritis, unspecified site: Secondary | ICD-10-CM | POA: Diagnosis not present

## 2014-07-31 DIAGNOSIS — C61 Malignant neoplasm of prostate: Secondary | ICD-10-CM | POA: Diagnosis not present

## 2014-07-31 DIAGNOSIS — Z1389 Encounter for screening for other disorder: Secondary | ICD-10-CM | POA: Diagnosis not present

## 2014-07-31 DIAGNOSIS — J069 Acute upper respiratory infection, unspecified: Secondary | ICD-10-CM | POA: Diagnosis not present

## 2014-07-31 DIAGNOSIS — E119 Type 2 diabetes mellitus without complications: Secondary | ICD-10-CM | POA: Diagnosis not present

## 2014-07-31 DIAGNOSIS — Z0000 Encounter for general adult medical examination without abnormal findings: Secondary | ICD-10-CM | POA: Diagnosis not present

## 2014-07-31 DIAGNOSIS — R9431 Abnormal electrocardiogram [ECG] [EKG]: Secondary | ICD-10-CM | POA: Diagnosis not present

## 2014-07-31 DIAGNOSIS — Z6832 Body mass index (BMI) 32.0-32.9, adult: Secondary | ICD-10-CM | POA: Diagnosis not present

## 2014-07-31 DIAGNOSIS — I10 Essential (primary) hypertension: Secondary | ICD-10-CM | POA: Diagnosis not present

## 2014-08-01 DIAGNOSIS — Z1212 Encounter for screening for malignant neoplasm of rectum: Secondary | ICD-10-CM | POA: Diagnosis not present

## 2014-12-04 DIAGNOSIS — C61 Malignant neoplasm of prostate: Secondary | ICD-10-CM | POA: Diagnosis not present

## 2014-12-04 DIAGNOSIS — E1165 Type 2 diabetes mellitus with hyperglycemia: Secondary | ICD-10-CM | POA: Diagnosis not present

## 2014-12-04 DIAGNOSIS — E669 Obesity, unspecified: Secondary | ICD-10-CM | POA: Diagnosis not present

## 2014-12-04 DIAGNOSIS — I10 Essential (primary) hypertension: Secondary | ICD-10-CM | POA: Diagnosis not present

## 2014-12-04 DIAGNOSIS — E785 Hyperlipidemia, unspecified: Secondary | ICD-10-CM | POA: Diagnosis not present

## 2014-12-04 DIAGNOSIS — M1990 Unspecified osteoarthritis, unspecified site: Secondary | ICD-10-CM | POA: Diagnosis not present

## 2014-12-04 DIAGNOSIS — Z8546 Personal history of malignant neoplasm of prostate: Secondary | ICD-10-CM | POA: Diagnosis not present

## 2014-12-04 DIAGNOSIS — Z6831 Body mass index (BMI) 31.0-31.9, adult: Secondary | ICD-10-CM | POA: Diagnosis not present

## 2014-12-17 DIAGNOSIS — Z8546 Personal history of malignant neoplasm of prostate: Secondary | ICD-10-CM | POA: Diagnosis not present

## 2015-03-01 DIAGNOSIS — Z23 Encounter for immunization: Secondary | ICD-10-CM | POA: Diagnosis not present

## 2015-04-14 DIAGNOSIS — E119 Type 2 diabetes mellitus without complications: Secondary | ICD-10-CM | POA: Diagnosis not present

## 2015-04-14 DIAGNOSIS — E784 Other hyperlipidemia: Secondary | ICD-10-CM | POA: Diagnosis not present

## 2015-04-14 DIAGNOSIS — Z6831 Body mass index (BMI) 31.0-31.9, adult: Secondary | ICD-10-CM | POA: Diagnosis not present

## 2015-04-14 DIAGNOSIS — C61 Malignant neoplasm of prostate: Secondary | ICD-10-CM | POA: Diagnosis not present

## 2015-04-14 DIAGNOSIS — I10 Essential (primary) hypertension: Secondary | ICD-10-CM | POA: Diagnosis not present

## 2015-05-02 DIAGNOSIS — B353 Tinea pedis: Secondary | ICD-10-CM | POA: Diagnosis not present

## 2015-05-02 DIAGNOSIS — D2261 Melanocytic nevi of right upper limb, including shoulder: Secondary | ICD-10-CM | POA: Diagnosis not present

## 2015-05-02 DIAGNOSIS — D485 Neoplasm of uncertain behavior of skin: Secondary | ICD-10-CM | POA: Diagnosis not present

## 2015-05-02 DIAGNOSIS — L853 Xerosis cutis: Secondary | ICD-10-CM | POA: Diagnosis not present

## 2015-05-02 DIAGNOSIS — L814 Other melanin hyperpigmentation: Secondary | ICD-10-CM | POA: Diagnosis not present

## 2015-05-02 DIAGNOSIS — D225 Melanocytic nevi of trunk: Secondary | ICD-10-CM | POA: Diagnosis not present

## 2015-05-02 DIAGNOSIS — Z85828 Personal history of other malignant neoplasm of skin: Secondary | ICD-10-CM | POA: Diagnosis not present

## 2015-05-02 DIAGNOSIS — L821 Other seborrheic keratosis: Secondary | ICD-10-CM | POA: Diagnosis not present

## 2015-05-02 DIAGNOSIS — D2262 Melanocytic nevi of left upper limb, including shoulder: Secondary | ICD-10-CM | POA: Diagnosis not present

## 2015-07-03 DIAGNOSIS — Z8546 Personal history of malignant neoplasm of prostate: Secondary | ICD-10-CM | POA: Diagnosis not present

## 2015-07-11 DIAGNOSIS — Z8546 Personal history of malignant neoplasm of prostate: Secondary | ICD-10-CM | POA: Diagnosis not present

## 2015-08-13 DIAGNOSIS — I10 Essential (primary) hypertension: Secondary | ICD-10-CM | POA: Diagnosis not present

## 2015-08-13 DIAGNOSIS — E1165 Type 2 diabetes mellitus with hyperglycemia: Secondary | ICD-10-CM | POA: Diagnosis not present

## 2015-08-13 DIAGNOSIS — E784 Other hyperlipidemia: Secondary | ICD-10-CM | POA: Diagnosis not present

## 2015-08-13 DIAGNOSIS — Z125 Encounter for screening for malignant neoplasm of prostate: Secondary | ICD-10-CM | POA: Diagnosis not present

## 2015-08-20 DIAGNOSIS — C61 Malignant neoplasm of prostate: Secondary | ICD-10-CM | POA: Diagnosis not present

## 2015-08-20 DIAGNOSIS — E119 Type 2 diabetes mellitus without complications: Secondary | ICD-10-CM | POA: Diagnosis not present

## 2015-08-20 DIAGNOSIS — L989 Disorder of the skin and subcutaneous tissue, unspecified: Secondary | ICD-10-CM | POA: Diagnosis not present

## 2015-08-20 DIAGNOSIS — Z6831 Body mass index (BMI) 31.0-31.9, adult: Secondary | ICD-10-CM | POA: Diagnosis not present

## 2015-08-20 DIAGNOSIS — Z1389 Encounter for screening for other disorder: Secondary | ICD-10-CM | POA: Diagnosis not present

## 2015-08-20 DIAGNOSIS — E784 Other hyperlipidemia: Secondary | ICD-10-CM | POA: Diagnosis not present

## 2015-08-20 DIAGNOSIS — M1990 Unspecified osteoarthritis, unspecified site: Secondary | ICD-10-CM | POA: Diagnosis not present

## 2015-08-20 DIAGNOSIS — Z0000 Encounter for general adult medical examination without abnormal findings: Secondary | ICD-10-CM | POA: Diagnosis not present

## 2015-08-20 DIAGNOSIS — I10 Essential (primary) hypertension: Secondary | ICD-10-CM | POA: Diagnosis not present

## 2015-08-20 DIAGNOSIS — E669 Obesity, unspecified: Secondary | ICD-10-CM | POA: Diagnosis not present

## 2015-08-21 DIAGNOSIS — Z1212 Encounter for screening for malignant neoplasm of rectum: Secondary | ICD-10-CM | POA: Diagnosis not present

## 2015-10-13 ENCOUNTER — Emergency Department (HOSPITAL_COMMUNITY): Payer: Medicare Other

## 2015-10-13 ENCOUNTER — Encounter (HOSPITAL_COMMUNITY): Payer: Self-pay | Admitting: *Deleted

## 2015-10-13 DIAGNOSIS — E785 Hyperlipidemia, unspecified: Secondary | ICD-10-CM | POA: Insufficient documentation

## 2015-10-13 DIAGNOSIS — E669 Obesity, unspecified: Secondary | ICD-10-CM | POA: Insufficient documentation

## 2015-10-13 DIAGNOSIS — I129 Hypertensive chronic kidney disease with stage 1 through stage 4 chronic kidney disease, or unspecified chronic kidney disease: Secondary | ICD-10-CM | POA: Insufficient documentation

## 2015-10-13 DIAGNOSIS — I25110 Atherosclerotic heart disease of native coronary artery with unstable angina pectoris: Secondary | ICD-10-CM | POA: Diagnosis not present

## 2015-10-13 DIAGNOSIS — R1110 Vomiting, unspecified: Secondary | ICD-10-CM | POA: Diagnosis not present

## 2015-10-13 DIAGNOSIS — Z880 Allergy status to penicillin: Secondary | ICD-10-CM | POA: Diagnosis not present

## 2015-10-13 DIAGNOSIS — E119 Type 2 diabetes mellitus without complications: Secondary | ICD-10-CM | POA: Insufficient documentation

## 2015-10-13 DIAGNOSIS — R0789 Other chest pain: Secondary | ICD-10-CM | POA: Diagnosis not present

## 2015-10-13 DIAGNOSIS — Z87891 Personal history of nicotine dependence: Secondary | ICD-10-CM | POA: Insufficient documentation

## 2015-10-13 DIAGNOSIS — Z8546 Personal history of malignant neoplasm of prostate: Secondary | ICD-10-CM | POA: Diagnosis not present

## 2015-10-13 DIAGNOSIS — R079 Chest pain, unspecified: Secondary | ICD-10-CM | POA: Diagnosis not present

## 2015-10-13 DIAGNOSIS — N189 Chronic kidney disease, unspecified: Secondary | ICD-10-CM | POA: Insufficient documentation

## 2015-10-13 DIAGNOSIS — Z8249 Family history of ischemic heart disease and other diseases of the circulatory system: Secondary | ICD-10-CM | POA: Diagnosis not present

## 2015-10-13 LAB — CBC
HCT: 45.4 % (ref 39.0–52.0)
Hemoglobin: 15.3 g/dL (ref 13.0–17.0)
MCH: 27.8 pg (ref 26.0–34.0)
MCHC: 33.7 g/dL (ref 30.0–36.0)
MCV: 82.4 fL (ref 78.0–100.0)
PLATELETS: 269 10*3/uL (ref 150–400)
RBC: 5.51 MIL/uL (ref 4.22–5.81)
RDW: 12.1 % (ref 11.5–15.5)
WBC: 7.4 10*3/uL (ref 4.0–10.5)

## 2015-10-13 LAB — BASIC METABOLIC PANEL
Anion gap: 8 (ref 5–15)
BUN: 18 mg/dL (ref 6–20)
CALCIUM: 9.3 mg/dL (ref 8.9–10.3)
CHLORIDE: 104 mmol/L (ref 101–111)
CO2: 25 mmol/L (ref 22–32)
CREATININE: 0.86 mg/dL (ref 0.61–1.24)
GFR calc Af Amer: >60 mL/min (ref >60)
GFR calc non Af Amer: >60 mL/min (ref >60)
Glucose, Bld: 174 mg/dL — ABNORMAL HIGH (ref 65–99)
Potassium: 3.9 mmol/L (ref 3.5–5.1)
SODIUM: 137 mmol/L (ref 135–145)

## 2015-10-13 LAB — I-STAT TROPONIN, ED: Troponin i, poc: 0 ng/mL (ref 0.00–0.08)

## 2015-10-13 NOTE — ED Notes (Signed)
Pt reports left side chest pain and tightness since Saturday. Had episode of n/v on Saturday after mowing. Denies sob. ekg done at triage, no resp distress noted.

## 2015-10-14 ENCOUNTER — Encounter (HOSPITAL_COMMUNITY): Payer: Self-pay | Admitting: Family Medicine

## 2015-10-14 ENCOUNTER — Encounter (HOSPITAL_COMMUNITY)
Admission: EM | Disposition: A | Discharge status: Home / Self Care | Payer: Self-pay | Source: Home / Self Care | Attending: Family Medicine

## 2015-10-14 ENCOUNTER — Observation Stay (HOSPITAL_COMMUNITY)
Admission: EM | Admit: 2015-10-14 | Discharge: 2015-10-15 | Disposition: A | Discharge status: Home / Self Care | Payer: Medicare Other | Attending: Family Medicine | Admitting: Family Medicine

## 2015-10-14 DIAGNOSIS — I25119 Atherosclerotic heart disease of native coronary artery with unspecified angina pectoris: Secondary | ICD-10-CM | POA: Diagnosis not present

## 2015-10-14 DIAGNOSIS — I10 Essential (primary) hypertension: Secondary | ICD-10-CM | POA: Diagnosis not present

## 2015-10-14 DIAGNOSIS — E119 Type 2 diabetes mellitus without complications: Secondary | ICD-10-CM

## 2015-10-14 DIAGNOSIS — I25110 Atherosclerotic heart disease of native coronary artery with unstable angina pectoris: Secondary | ICD-10-CM | POA: Diagnosis not present

## 2015-10-14 DIAGNOSIS — R079 Chest pain, unspecified: Secondary | ICD-10-CM | POA: Diagnosis not present

## 2015-10-14 DIAGNOSIS — M79603 Pain in arm, unspecified: Secondary | ICD-10-CM

## 2015-10-14 DIAGNOSIS — M25519 Pain in unspecified shoulder: Secondary | ICD-10-CM

## 2015-10-14 DIAGNOSIS — E785 Hyperlipidemia, unspecified: Secondary | ICD-10-CM

## 2015-10-14 HISTORY — PX: CARDIAC CATHETERIZATION: SHX172

## 2015-10-14 LAB — GLUCOSE, CAPILLARY
GLUCOSE-CAPILLARY: 156 mg/dL — AB (ref 65–99)
GLUCOSE-CAPILLARY: 100 mg/dL — AB (ref 65–99)
Glucose-Capillary: 137 mg/dL — ABNORMAL HIGH (ref 65–99)
Glucose-Capillary: 123 mg/dL — ABNORMAL HIGH (ref 65–99)
Glucose-Capillary: 189 mg/dL — ABNORMAL HIGH (ref 65–99)

## 2015-10-14 LAB — LIPID PANEL
CHOL/HDL RATIO: 3.9 ratio
CHOLESTEROL: 151 mg/dL (ref 0–200)
HDL: 39 mg/dL — AB (ref >40)
LDL Cholesterol: 72 mg/dL (ref 0–99)
Triglycerides: 198 mg/dL — ABNORMAL HIGH (ref <150)
VLDL: 40 mg/dL (ref 0–40)

## 2015-10-14 LAB — CBC
HEMATOCRIT: 43 % (ref 39.0–52.0)
HEMOGLOBIN: 14 g/dL (ref 13.0–17.0)
MCH: 27.4 pg (ref 26.0–34.0)
MCHC: 32.6 g/dL (ref 30.0–36.0)
MCV: 84.1 fL (ref 78.0–100.0)
Platelets: 231 10*3/uL (ref 150–400)
RBC: 5.11 MIL/uL (ref 4.22–5.81)
RDW: 12.4 % (ref 11.5–15.5)
WBC: 7.4 10*3/uL (ref 4.0–10.5)

## 2015-10-14 LAB — CREATININE, SERUM
Creatinine, Ser: 0.86 mg/dL (ref 0.61–1.24)
GFR calc Af Amer: >60 mL/min (ref >60)
GFR calc non Af Amer: >60 mL/min (ref >60)

## 2015-10-14 LAB — PROTIME-INR
INR: 0.97 (ref 0.00–1.49)
PROTHROMBIN TIME: 13.1 s (ref 11.6–15.2)

## 2015-10-14 LAB — TROPONIN I: Troponin I: <0.03 ng/mL (ref <0.031)

## 2015-10-14 SURGERY — LEFT HEART CATH AND CORONARY ANGIOGRAPHY

## 2015-10-14 MED ORDER — MIDAZOLAM HCL 2 MG/2ML IJ SOLN
INTRAMUSCULAR | Status: AC
  Filled 2015-10-14: qty 2

## 2015-10-14 MED ORDER — ATROPINE SULFATE 1 MG/10ML IJ SOSY
PREFILLED_SYRINGE | INTRAMUSCULAR | Status: AC
  Filled 2015-10-14: qty 10

## 2015-10-14 MED ORDER — ACETAMINOPHEN 325 MG PO TABS: 650.0000 mg | ORAL_TABLET | ORAL | Status: DC | PRN

## 2015-10-14 MED ORDER — HEPARIN (PORCINE) IN NACL 2-0.9 UNIT/ML-% IJ SOLN
INTRAMUSCULAR | Status: DC | PRN
  Administered 2015-10-14: 1000 mL

## 2015-10-14 MED ORDER — ENOXAPARIN SODIUM 40 MG/0.4ML ~~LOC~~ SOLN
40.0000 mg | SUBCUTANEOUS | Status: DC
  Filled 2015-10-14: qty 0.4

## 2015-10-14 MED ORDER — ASPIRIN EC 81 MG PO TBEC
81.0000 mg | DELAYED_RELEASE_TABLET | Freq: Every day | ORAL | Status: DC
  Administered 2015-10-14 – 2015-10-15 (×2): 81 mg via ORAL
  Filled 2015-10-14 (×2): qty 1

## 2015-10-14 MED ORDER — BENAZEPRIL HCL 20 MG PO TABS
20.0000 mg | ORAL_TABLET | Freq: Every day | ORAL | Status: DC
  Administered 2015-10-15: 20 mg via ORAL
  Filled 2015-10-14 (×2): qty 1

## 2015-10-14 MED ORDER — ASPIRIN 81 MG PO CHEW
81.0000 mg | CHEWABLE_TABLET | Freq: Once | ORAL | Status: AC
  Administered 2015-10-14: 81 mg via ORAL
  Filled 2015-10-14: qty 1

## 2015-10-14 MED ORDER — ATROPINE SULFATE 1 MG/10ML IJ SOSY
PREFILLED_SYRINGE | INTRAMUSCULAR | Status: DC | PRN
  Administered 2015-10-14: 0.5 mg via INTRAVENOUS

## 2015-10-14 MED ORDER — ASPIRIN 81 MG PO CHEW: 81.0000 mg | CHEWABLE_TABLET | ORAL | Status: DC

## 2015-10-14 MED ORDER — ONDANSETRON HCL 4 MG/2ML IJ SOLN: 4.0000 mg | Freq: Four times a day (QID) | INTRAMUSCULAR | Status: DC | PRN

## 2015-10-14 MED ORDER — HEPARIN SODIUM (PORCINE) 1000 UNIT/ML IJ SOLN
INTRAMUSCULAR | Status: AC
  Filled 2015-10-14: qty 1

## 2015-10-14 MED ORDER — SODIUM CHLORIDE 0.9% FLUSH
3.0000 mL | Freq: Two times a day (BID) | INTRAVENOUS | Status: DC
  Administered 2015-10-14: 3 mL via INTRAVENOUS

## 2015-10-14 MED ORDER — INSULIN ASPART 100 UNIT/ML ~~LOC~~ SOLN: 1.0000 [IU] | Freq: Three times a day (TID) | SUBCUTANEOUS | Status: DC

## 2015-10-14 MED ORDER — IOPAMIDOL (ISOVUE-370) INJECTION 76%
INTRAVENOUS | Status: DC | PRN
  Administered 2015-10-14: 95 mL via INTRA_ARTERIAL

## 2015-10-14 MED ORDER — SODIUM CHLORIDE 0.9% FLUSH
3.0000 mL | Freq: Two times a day (BID) | INTRAVENOUS | Status: DC
  Administered 2015-10-14 – 2015-10-15 (×2): 3 mL via INTRAVENOUS

## 2015-10-14 MED ORDER — HEPARIN (PORCINE) IN NACL 2-0.9 UNIT/ML-% IJ SOLN
INTRAMUSCULAR | Status: AC
  Filled 2015-10-14: qty 1000

## 2015-10-14 MED ORDER — SODIUM CHLORIDE 0.9 % IV SOLN
INTRAVENOUS | Status: DC
  Administered 2015-10-14: 11:00:00 via INTRAVENOUS
  Administered 2015-10-14: 250 mL via INTRAVENOUS

## 2015-10-14 MED ORDER — ASPIRIN 81 MG PO CHEW: 81.0000 mg | CHEWABLE_TABLET | Freq: Every day | ORAL | Status: DC

## 2015-10-14 MED ORDER — SODIUM CHLORIDE 0.9% FLUSH: 3.0000 mL | INTRAVENOUS | Status: DC | PRN

## 2015-10-14 MED ORDER — MIDAZOLAM HCL 2 MG/2ML IJ SOLN
INTRAMUSCULAR | Status: DC | PRN
  Administered 2015-10-14: 1 mg via INTRAVENOUS

## 2015-10-14 MED ORDER — AMLODIPINE BESYLATE 5 MG PO TABS
5.0000 mg | ORAL_TABLET | Freq: Every day | ORAL | Status: DC
  Administered 2015-10-14 – 2015-10-15 (×2): 5 mg via ORAL
  Filled 2015-10-14 (×2): qty 1

## 2015-10-14 MED ORDER — ATORVASTATIN CALCIUM 80 MG PO TABS
80.0000 mg | ORAL_TABLET | Freq: Every day | ORAL | Status: DC
  Administered 2015-10-14: 80 mg via ORAL
  Filled 2015-10-14: qty 1

## 2015-10-14 MED ORDER — ATORVASTATIN CALCIUM 20 MG PO TABS: 20.0000 mg | ORAL_TABLET | Freq: Every evening | ORAL | Status: DC

## 2015-10-14 MED ORDER — FENTANYL CITRATE (PF) 100 MCG/2ML IJ SOLN
INTRAMUSCULAR | Status: AC
  Filled 2015-10-14: qty 2

## 2015-10-14 MED ORDER — SODIUM CHLORIDE 0.9 % WEIGHT BASED INFUSION: 3.0000 mL/kg/h | INTRAVENOUS | Status: AC

## 2015-10-14 MED ORDER — LIDOCAINE HCL (PF) 1 % IJ SOLN
INTRAMUSCULAR | Status: AC
  Filled 2015-10-14: qty 30

## 2015-10-14 MED ORDER — MORPHINE SULFATE (PF) 2 MG/ML IV SOLN: 2.0000 mg | INTRAVENOUS | Status: DC | PRN

## 2015-10-14 MED ORDER — FENTANYL CITRATE (PF) 100 MCG/2ML IJ SOLN
INTRAMUSCULAR | Status: DC | PRN
  Administered 2015-10-14: 25 ug via INTRAVENOUS

## 2015-10-14 MED ORDER — GI COCKTAIL ~~LOC~~: 30.0000 mL | Freq: Four times a day (QID) | ORAL | Status: DC | PRN

## 2015-10-14 MED ORDER — VERAPAMIL HCL 2.5 MG/ML IV SOLN
INTRAVENOUS | Status: AC
  Filled 2015-10-14: qty 2

## 2015-10-14 MED ORDER — NITROGLYCERIN 1 MG/10 ML FOR IR/CATH LAB
INTRA_ARTERIAL | Status: AC
  Filled 2015-10-14: qty 10

## 2015-10-14 MED ORDER — LIDOCAINE HCL (PF) 1 % IJ SOLN
INTRAMUSCULAR | Status: DC | PRN
  Administered 2015-10-14: 2 mL
  Administered 2015-10-14: 20 mL via SUBCUTANEOUS

## 2015-10-14 MED ORDER — SODIUM CHLORIDE 0.9 % IV SOLN: 250.0000 mL | INTRAVENOUS | Status: DC | PRN

## 2015-10-14 SURGICAL SUPPLY — 14 items
CATH INFINITI 5FR ANG PIGTAIL (CATHETERS) ×3 IMPLANT
CATH INFINITI 5FR JL4 (CATHETERS) ×3 IMPLANT
CATH INFINITI JR4 5F (CATHETERS) ×3 IMPLANT
CATH OPTITORQUE TIG 4.0 5F (CATHETERS) IMPLANT
GLIDESHEATH SLEND A-KIT 6F 22G (SHEATH) ×3 IMPLANT
KIT HEART LEFT (KITS) ×3 IMPLANT
PACK CARDIAC CATHETERIZATION (CUSTOM PROCEDURE TRAY) ×3 IMPLANT
SHEATH PINNACLE 5F 10CM (SHEATH) ×3 IMPLANT
SYR MEDRAD MARK V 150ML (SYRINGE) ×3 IMPLANT
TRANSDUCER W/STOPCOCK (MISCELLANEOUS) ×3 IMPLANT
TUBING CIL FLEX 10 FLL-RA (TUBING) ×3 IMPLANT
WIRE EMERALD 3MM-J .035X150CM (WIRE) ×3 IMPLANT
WIRE HI TORQ VERSACORE-J 145CM (WIRE) ×3 IMPLANT
WIRE SAFE-T 1.5MM-J .035X260CM (WIRE) IMPLANT

## 2015-10-14 NOTE — ED Provider Notes (Signed)
CSN: [id number]     Arrival date & time 10/13/15  1538 History   First MD Initiated Contact with Patient 10/14/15 0044     Chief Complaint  Patient presents with  . Chest Pain     (Consider location/radiation/quality/duration/timing/severity/associated sxs/prior Treatment) Patient is a 70 y.o. male presenting with chest pain. The history is provided by the patient and the spouse. No language interpreter was used.  Chest Pain Pain location:  L chest Pain quality: aching and pressure   Pain radiates to:  L shoulder and L arm Pain severity:  Moderate Duration:  2 days Timing:  Constant Associated symptoms: vomiting   Associated symptoms: no abdominal pain, no fever, no nausea, no palpitations and no shortness of breath   Associated symptoms comment:  Patient with history of diabetes, HTN, HLD presents with chest pain in left chest that started 2 days ago and has been constant since it began. No alleviating or aggravating factors. No SOB, nausea. He had one episode of vomiting yesterday, none since. The pain radiates to left shoulder and left arm. He has not taken anything for his discomfort. He reports no hisotry of heart problems, including negative stress test "years" ago done as a routine test.    Past Medical History  Diagnosis Date  . Diabetes mellitus     oral meds  . Hypertension     stress test 5 yrs ago- per pt wnl  . Prostate cancer ([place])     SEES DR [place]  . Joint ache LEGS    OCCASIONAL   Past Surgical History  Procedure Laterality Date  . Radioactive seed implant  04/14/2011    Procedure: RADIOACTIVE SEED IMPLANT;  Surgeon: Bernestine Amass, MD;  Location: [facility];  Service: Urology;  Laterality: N/A;  . Hydrocele excision  04/14/2011    Procedure: HYDROCELECTOMY ADULT;  Surgeon: Bernestine Amass, MD;  Location: [facility];  Service: Urology;  Laterality: Left;   History reviewed. No pertinent family history. Social  History  Substance Use Topics  . Smoking status: Former Smoker    Types: Cigarettes    Quit date: 04/11/1981  . Smokeless tobacco: Never Used  . Alcohol Use: No    Review of Systems  Constitutional: Negative for fever and chills.  Respiratory: Negative.  Negative for shortness of breath.   Cardiovascular: Positive for chest pain. Negative for palpitations and leg swelling.  Gastrointestinal: Positive for vomiting. Negative for nausea and abdominal pain.  Musculoskeletal: Negative.  Negative for myalgias.  Skin: Negative.   Neurological: Negative.       Allergies  Penicillins  Home Medications   Prior to Admission medications   Medication Sig Start Date End Date Taking? Authorizing Provider  amLODipine-benazepril (LOTREL) 5-20 MG per capsule Take 1 capsule by mouth daily.     Yes Historical Provider, MD  atorvastatin (LIPITOR) 20 MG tablet Take 20 mg by mouth every evening.     Yes Historical Provider, MD  INVOKAMET [phone] MG TABS Take 1 tablet by mouth 2 (two) times daily. 09/24/15  Yes Historical Provider, MD   BP 132/74 mmHg  Pulse 80  Temp(Src) 98.3 F (36.8 C) (Oral)  Resp 20  SpO2 97% Physical Exam  Constitutional: He is oriented to person, place, and time. He appears well-developed and well-nourished.  HENT:  Head: Normocephalic.  Neck: Normal range of motion. Neck supple.  Cardiovascular: Normal rate and regular rhythm.   No murmur heard. Pulmonary/Chest: Effort normal  and breath sounds normal. He has no wheezes. He has no rales. He exhibits tenderness (mild tenderness to left chest).  Abdominal: Soft. Bowel sounds are normal. There is no tenderness. There is no rebound and no guarding.  Musculoskeletal: Normal range of motion. He exhibits no edema.  Neurological: He is alert and oriented to person, place, and time.  Skin: Skin is warm and dry. No rash noted.  Psychiatric: He has a normal mood and affect.    ED Course  Procedures (including critical care  time) Labs Review Labs Reviewed  BASIC METABOLIC PANEL - Abnormal; Notable for the following:    Glucose, Bld 174 (*)    All other components within normal limits  CBC  I-STAT TROPOININ, ED    Imaging Review Dg Chest 2 View  10/13/2015  CLINICAL DATA:  Left-sided chest pain for 3 days EXAM: CHEST  2 VIEW COMPARISON:  03/23/2011 FINDINGS: Heart size and vascular pattern are normal. Lungs are clear. No consolidation or effusion. Bony thorax intact. IMPRESSION: No active cardiopulmonary disease. Electronically Signed   By: Skipper Cliche M.D.   On: 10/13/2015 16:28   I have personally reviewed and evaluated these images and lab results as part of my medical decision-making.   EKG Interpretation None      MDM   Final diagnoses:  None    1. Chest pain  Patient with a 2-day history of left chest discomfort, constant, radiating to left shoulder blade and left arm. No SOB. One episode vomiting yesterday after mowing the grass. Negative EKG for acute ischemia, negative troponin. Patient has a heart score of 5-6 with significant risk factors, age >3, worrisome history. Will admit to hospitalist for rule out admission. Dr. Loleta Books accepting physician.     Charlann Lange, PA-C 10/14/15 [place], MD 10/14/15 [phone]

## 2015-10-14 NOTE — Interval H&P Note (Signed)
Cath Lab Visit (complete for each Cath Lab visit)  Clinical Evaluation Leading to the Procedure:   ACS: Yes.    Non-ACS:    Anginal Classification: CCS IV  Anti-ischemic medical therapy: No Therapy  Non-Invasive Test Results: No non-invasive testing performed  Prior CABG: No previous CABG      History and Physical Interval Note:  10/14/2015 1:46 PM  Christopher Barnett  has presented today for surgery, with the diagnosis of cp  The various methods of treatment have been discussed with the patient and family. After consideration of risks, benefits and other options for treatment, the patient has consented to  Procedure(s): Left Heart Cath and Coronary Angiography (N/A) as a surgical intervention .  The patient's history has been reviewed, patient examined, no change in status, stable for surgery.  I have reviewed the patient's chart and labs.  Questions were answered to the patient's satisfaction.     Quay Burow

## 2015-10-14 NOTE — H&P (View-Only) (Signed)
Patient ID: Christopher Barnett MRN: [id number], DOB/AGE: 10-14-1945   Admit date: 10/14/2015   Primary Physician: No primary care provider on file. Primary Cardiologist: New (Dr. Gwenlyn Found)  Reason for Consult: CP Requesting MD: Dr. Loleta Books, IM  Pt. Profile:  69 y/o male with h/o NIDDM, HTN, HLD, remote h/o tobacco use and family h/o CVD, admitted for new onset CP consistent with unstable angina.   Problem List  Past Medical History  Diagnosis Date  . Diabetes mellitus     oral meds  . Hypertension     stress test 5 yrs ago- per pt wnl  . Prostate cancer ([place])     SEES DR [place]  . Joint ache LEGS    OCCASIONAL    Past Surgical History  Procedure Laterality Date  . Radioactive seed implant  04/14/2011    Procedure: RADIOACTIVE SEED IMPLANT;  Surgeon: Bernestine Amass, MD;  Location: [facility];  Service: Urology;  Laterality: N/A;  . Hydrocele excision  04/14/2011    Procedure: HYDROCELECTOMY ADULT;  Surgeon: Bernestine Amass, MD;  Location: [facility];  Service: Urology;  Laterality: Left;     Allergies  Allergies  Allergen Reactions  . Penicillins Rash    HPI  70 y/o male with h/o NIDDM, HTN, HLD, remote h/o tobacco use and family h/o CVD, admitted for new onset CP consistent with unstable angina. His brother has CAD and underwent CABG in his late 58s. His father died suddenly from a ruptured AAA. He was a smoker. The patient denies any prior personal h/o CAD. He reports having a stress test more than 10 years ago that was normal.   He was in his usual state of health until Friday, 4 days ago. He was out mowing his lawn with a ridding mower, when he developed acute n/v. He denied any other symptoms at that time. The following day, he developed right sided chest pain radiating to his left arm and left shoulder blade. Occurs off and on. Feels like pressure/ indigestion. He cannot recall if worsened by exertion. Not worse with  meals. No exacerbating factors. He tried no OTC meds at home. He denies any associated dyspnea, diaphoresis, palpations, dizziness, syncope/ near syncope. Given his persistent CP, he presented to [facility] for evaluation.   Cardiac enzymes are negative x2. EKG shows NSR w/o ischemia. CXR negative. CBC and BMP unremarkable. He continues to have intermittent, mild discomfort in his left arm. He has not been administered any NTG.   Home Medications  Prior to Admission medications   Medication Sig Start Date End Date Taking? Authorizing Provider  amLODipine-benazepril (LOTREL) 5-20 MG per capsule Take 1 capsule by mouth daily.     Yes Historical Provider, MD  atorvastatin (LIPITOR) 20 MG tablet Take 20 mg by mouth every evening.     Yes Historical Provider, MD  INVOKAMET [phone] MG TABS Take 1 tablet by mouth 2 (two) times daily. 09/24/15  Yes Historical Provider, MD    Family History  Family History  Problem Relation Age of Onset  . Breast cancer Mother   . Coronary artery disease Brother   . Heart disease Maternal Grandfather     Social History  Social History   Social History  . Marital Status: Married    Spouse Name: N/A  . Number of Children: N/A  . Years of Education: N/A   Occupational History  . Not on file.   Social History Main Topics  .  Smoking status: Former Smoker    Types: Cigarettes    Quit date: 04/11/1981  . Smokeless tobacco: Never Used  . Alcohol Use: No  . Drug Use: No  . Sexual Activity: Not on file   Other Topics Concern  . Not on file   Social History Narrative     Review of Systems General:  No chills, fever, night sweats or weight changes.  Cardiovascular:  + chest pain, no dyspnea on exertion, edema, orthopnea, palpitations, paroxysmal nocturnal dyspnea. Dermatological: No rash, lesions/masses Respiratory: No cough, dyspnea Urologic: No hematuria, dysuria Abdominal:   No nausea, vomiting, diarrhea, bright red blood per rectum, melena, or  hematemesis Neurologic:  No visual changes, wkns, changes in mental status. All other systems reviewed and are otherwise negative except as noted above.  Physical Exam  Blood pressure 140/79, pulse 64, temperature 97.8 F (36.6 C), temperature source Oral, resp. rate 18, weight 228 lb 14.4 oz (103.828 kg), SpO2 98 %.  General: Pleasant, NAD Psych: Normal affect. Neuro: Alert and oriented X 3. Moves all extremities spontaneously. HEENT: Normal  Neck: Supple without bruits or JVD. Lungs:  Resp regular and unlabored, CTA. Heart: RRR no s3, s4, or murmurs. Abdomen: Soft, non-tender, non-distended, BS + x 4.  Extremities: No clubbing, cyanosis or edema. DP/PT/Radials 2+ and equal bilaterally.  Labs  Troponin [facility] of Care Test)  Recent Labs  10/13/15 1602  TROPIPOC 0.00    Recent Labs  10/14/15 0131 10/14/15 0602  TROPONINI <0.03 <0.03   Lab Results  Component Value Date   WBC 7.4 10/14/2015   HGB 14.0 10/14/2015   HCT 43.0 10/14/2015   MCV 84.1 10/14/2015   PLT 231 10/14/2015     Recent Labs Lab 10/13/15 1605 10/14/15 0602  NA 137  --   K 3.9  --   CL 104  --   CO2 25  --   BUN 18  --   CREATININE 0.86 0.86  CALCIUM 9.3  --   GLUCOSE 174*  --    No results found for: CHOL, HDL, LDLCALC, TRIG No results found for: DDIMER   Radiology/Studies  Dg Chest 2 View  10/13/2015  CLINICAL DATA:  Left-sided chest pain for 3 days EXAM: CHEST  2 VIEW COMPARISON:  03/23/2011 FINDINGS: Heart size and vascular pattern are normal. Lungs are clear. No consolidation or effusion. Bony thorax intact. IMPRESSION: No active cardiopulmonary disease. Electronically Signed   By: Skipper Cliche M.D.   On: 10/13/2015 16:28    ECG  NSR. No ischemia  ASSESSMENT AND PLAN  Principal Problem:   Chest pain with moderate risk for cardiac etiology Active Problems:   Diabetes mellitus   Hypertension   70 y/o male with multiple cardiac risk factors including NIDDM, HTN, HLD,  remote h/o tobacco use and family h/o CVD (brother with CAD s/p CABG in his 48s and father who died suddenly from AAA rupture), admitted for new onset CP consistent with unstable angina. Cardiac enzymes are negative x 2. EKG shows NSR w/o ischemia. He will need further cardiac w/u to exclude CAD. Keep NPO. Will discuss with Dr. Gwenlyn Found risk stratification with NST vs definitive assessment with LHC today. Continue risk factor modification with medical management of HTN, HLD and DM. Recommend initiation of ASA. RN SL NTG. Restart home statin. Obtain fasting LP for baseline assessment of lipids. Continue antihypertensives for BP control and medical management of DM.   Signed, Lyda Jester, PA-C 10/14/2015, 9:25 AM  Agree with note written  by Ellen Henri  [facility]  Pt admitted with [place]. + CRF. EKG w/o acute changes. Enz neg. Labs OK. Exam benign. Needs cor angio to define anatomy and R/O CAD.  I have reviewed the risks, indications, and alternatives to cardiac catheterization, possible angioplasty, and stenting with the patient. Risks include but are not limited to bleeding, infection, vascular injury, stroke, myocardial infection, arrhythmia, kidney injury, radiation-related injury in the case of prolonged fluoroscopy use, emergency cardiac surgery, and death. The patient understands the risks of serious complication is 1-2 in [id number] with diagnostic cardiac cath and 1-2% or less with angioplasty/stenting.   Quay Burow 10/14/2015 9:53 AM

## 2015-10-14 NOTE — Progress Notes (Signed)
Subjective: Patient admitted this morning, see detailed H&P by Dr. [place] Service 70 y.o. male with a past medical history significant for NIDDM and HTN and obesity who presents with chest pain.  The pain started two days ago while the patient was riding his riding lawnmower, had sudden nausea/malaise and vomited. Since then, the pain has been present and more or less constant. It is mild-to-moderate in intensity, "pressure"-like, left-sided, radiating into the left arm and left scapula. It is not worsened with walking or improved with rest. It is not worse with eating or moving the arm. He is not a smoker (very remote hx), brother with CAD and CABG, slightly older than patient now. Patient has HTN and NIDDM.  Patient continues to have pain in the left arm. Filed Vitals:   10/14/15 1505 10/14/15 1510  BP: 128/70 133/68  Pulse: 79 81  Temp:    Resp: 17 17    Chest: Clear Bilaterally Heart : S1S2 RRR Abdomen: Soft, nontender Ext : No edema Left shoulder- pain elicited on abduction of left shoulder Neuro: Alert, oriented x 3  A/P Chest pain Left shoulder pain Hyperlipidemia Hypertension Diabetes mellitus  Consult cardiology for possible cardiac stress test today Check x-ray of left shoulder X-ray cervical spine Sliding scale insulin with NovoLog   Oswald Hillock [facility] [phone]

## 2015-10-14 NOTE — H&P (Signed)
History and Physical  Patient Name: Christopher Barnett     [id number]    DOB: 1945-10-25    DOA: 10/14/2015 PCP: No primary care provider on file.   Patient coming from: Home     Chief Complaint: Chest pain  HPI: Christopher Barnett is a 70 y.o. male with a past medical history significant for NIDDM and HTN and obesity who presents with chest pain.  The pain started two days ago while the patient was riding his riding lawnmower, had sudden nausea/malaise and vomited.  Since then, the pain has been present and more or less constant.  It is mild-to-moderate in intensity, "pressure"-like, left-sided, radiating into the left arm and left scapula.  It is not worsened with walking or improved with rest.  It is not worse with eating or moving the arm.  He is not a smoker (very remote hx), brother with CAD and CABG, slightly older than patient now.  Patient has HTN and NIDDM.  ED course: -Afebrile, hemodynamically stable, slightly hypertensive -Initial ECG showed NSR and troponin was negative. -Na 137, K 3.9, Cr 0.8, WBC 7.4K, Hgb 15 -Aspirin was given and TRH was asked to admit for observation, serial troponins and risk stratification.     Review of Systems:  Pt denies any recent surgery or immobilization, denies dyspnea, diaphoresis, leg swelling (unilateral or bilateral), hemoptysis, PND or orthopnea.  All other systems negative except as just noted or noted in the history of present illness.  Past Medical History  Diagnosis Date  . Diabetes mellitus     oral meds  . Hypertension     stress test 5 yrs ago- per pt wnl  . Prostate cancer ([place])     SEES DR [place]  . Joint ache LEGS    OCCASIONAL    Past Surgical History  Procedure Laterality Date  . Radioactive seed implant  04/14/2011    Procedure: RADIOACTIVE SEED IMPLANT;  Surgeon: Bernestine Amass, MD;  Location: [facility];  Service: Urology;  Laterality: N/A;  . Hydrocele excision  04/14/2011   Procedure: HYDROCELECTOMY ADULT;  Surgeon: Bernestine Amass, MD;  Location: [facility];  Service: Urology;  Laterality: Left;    Social History: Patient lives with his wife, he is retired.  Patient walks without a cane or walker.  He is independent with all ADLs and IADLs.  He has a trivial smoking history.    Allergies  Allergen Reactions  . Penicillins Rash    Family history: family history includes Breast cancer in his mother; Coronary artery disease in his brother; Heart disease in his maternal grandfather.  Prior to Admission medications   Medication Sig Start Date End Date Taking? Authorizing Provider  amLODipine-benazepril (LOTREL) 5-20 MG per capsule Take 1 capsule by mouth daily.     Yes Historical Provider, MD  atorvastatin (LIPITOR) 20 MG tablet Take 20 mg by mouth every evening.     Yes Historical Provider, MD  INVOKAMET [phone] MG TABS Take 1 tablet by mouth 2 (two) times daily. 09/24/15  Yes Historical Provider, MD       Physical Exam: BP 151/88 mmHg  Pulse 71  Temp(Src) 98.3 F (36.8 C) (Oral)  Resp 15  SpO2 96% General appearance: Well-developed, adult male, alert and in no acute distress.   Eyes: Anicteric, conjunctiva pink, lids and lashes normal.     ENT: No nasal deformity, discharge, or epistaxis.  OP moist without lesions.   Skin: Warm and dry.  Cardiac: RRR, nl S1-S2, no murmurs appreciated.  Capillary refill is brisk.  JVP normal.  No LE edema.  Radial and DP pulses 2+ and symmetric.  No carotid bruits. Respiratory: Normal respiratory rate and rhythm.  CTAB without rales or wheezes. Abdomen: Abdomen soft without rigidity.  No TTP. No ascites, distension.   MSK: No deformities or effusions.   Pain not reproduced with palpation of precordium.  No pain with arm movement. Neuro: Sensorium intact and responding to questions, attention normal.  Speech is fluent.  Moves all extremities equally and with normal coordination.    Psych: Behavior  appropriate.  Affect normal.  No evidence of aural or visual hallucinations or delusions.       Labs on Admission:  The metabolic panel shows normal electrolytes and renal function. The complete blood count shows no anemia, leukocytosis or thrombocytopenia. The initial troponin is negative.  Radiological Exams on Admission: Personally reviewed: Dg Chest 2 View  10/13/2015  CLINICAL DATA:  Left-sided chest pain for 3 days EXAM: CHEST  2 VIEW COMPARISON:  03/23/2011 FINDINGS: Heart size and vascular pattern are normal. Lungs are clear. No consolidation or effusion. Bony thorax intact. IMPRESSION: No active cardiopulmonary disease. Electronically Signed   By: Skipper Cliche M.D.   On: 10/13/2015 16:28    EKG: Independently reviewed. Rate 87, QTc 454, normal sinus rhythm without ST segment changes or TWI.    Assessment/Plan 1. Chest pain: This is new.  The patient has HEART score of 3. Angina is possible, but atypical given duration of time and lack of exertional worsening.  Other potential causes of chest pain (PE, dissection, pancreatitis, pneumonia/effusion, pericarditis) are doubted, wells score 0.  We have been asked to admit the patient for observation and etiology consultation with Cardiology tomorrow.  -Serial troponins are ordered -Telemetry -Consult to cardiology, appreciate recommendations   2. HTN:  -Continue home amlodipine and benazepril    3. NIDDM:  -Hold home Invokamet while inpatient -Sliding scale corrections as needed     DVT prophylaxis: Lovenox Diet: NPO after 4am for anticipated stress testing Code Status: Full  Family Communication: Wife at bedside  Disposition Plan: Anticipate overnight observation for arrhythmia on telemetry, serial troponins and subsequent risk stratification by Cardiology.  If testing negative, home after. Consults called: Cardiology notified, via electronic message Admission status: Telemetry   Medical decision making: Patient  seen at 2:10 AM on 10/14/2015.  The patient was discussed with Christopher Reus, PA-C. What exists of the patient's chart was reviewed in depth.  Clinical condition: stable.      Christopher Barnett [facility] Pager [phone]

## 2015-10-14 NOTE — Progress Notes (Signed)
Site area: rt groin Site Prior to Removal:  Level  0 Pressure Applied For:  20 minutes Manual:   yes Patient Status During Pull:  stable Post Pull Site:  Level  0 Post Pull Instructions Given:  yes Post Pull Pulses Present: stable Dressing Applied:  tegaderm Bedrest begins @  [id number] Comments:

## 2015-10-14 NOTE — Consult Note (Signed)
Patient ID: Christopher Barnett MRN: [id number], DOB/AGE: [birth year]   Admit date: 10/14/2015   Primary Physician: No primary care provider on file. Primary Cardiologist: New (Dr. Gwenlyn Found)  Reason for Consult: CP Requesting MD: Dr. Loleta Books, IM  Pt. Profile:  70 y/o male with h/o NIDDM, HTN, HLD, remote h/o tobacco use and family h/o CVD, admitted for new onset CP consistent with unstable angina.   Problem List  Past Medical History  Diagnosis Date  . Diabetes mellitus     oral meds  . Hypertension     stress test 5 yrs ago- per pt wnl  . Prostate cancer ([place])     SEES DR Camargo  . Joint ache LEGS    OCCASIONAL    Past Surgical History  Procedure Laterality Date  . Radioactive seed implant  04/14/2011    Procedure: RADIOACTIVE SEED IMPLANT;  Surgeon: Bernestine Amass, MD;  Location: [facility];  Service: Urology;  Laterality: N/A;  . Hydrocele excision  04/14/2011    Procedure: HYDROCELECTOMY ADULT;  Surgeon: Bernestine Amass, MD;  Location: [facility];  Service: Urology;  Laterality: Left;     Allergies  Allergies  Allergen Reactions  . Penicillins Rash    HPI  70 y/o male with h/o NIDDM, HTN, HLD, remote h/o tobacco use and family h/o CVD, admitted for new onset CP consistent with unstable angina. His brother has CAD and underwent CABG in his late 89s. His father died suddenly from a ruptured AAA. He was a smoker. The patient denies any prior personal h/o CAD. He reports having a stress test more than 10 years ago that was normal.   He was in his usual state of health until Friday, 4 days ago. He was out mowing his lawn with a ridding mower, when he developed acute n/v. He denied any other symptoms at that time. The following day, he developed right sided chest pain radiating to his left arm and left shoulder blade. Occurs off and on. Feels like pressure/ indigestion. He cannot recall if worsened by exertion. Not worse with  meals. No exacerbating factors. He tried no OTC meds at home. He denies any associated dyspnea, diaphoresis, palpations, dizziness, syncope/ near syncope. Given his persistent CP, he presented to [facility] for evaluation.   Cardiac enzymes are negative x2. EKG shows NSR w/o ischemia. CXR negative. CBC and BMP unremarkable. He continues to have intermittent, mild discomfort in his left arm. He has not been administered any NTG.   Home Medications  Prior to Admission medications   Medication Sig Start Date End Date Taking? Authorizing Provider  amLODipine-benazepril (LOTREL) 5-20 MG per capsule Take 1 capsule by mouth daily.     Yes Historical Provider, MD  atorvastatin (LIPITOR) 20 MG tablet Take 20 mg by mouth every evening.     Yes Historical Provider, MD  INVOKAMET [phone] MG TABS Take 1 tablet by mouth 2 (two) times daily. 09/24/15  Yes Historical Provider, MD    Family History  Family History  Problem Relation Age of Onset  . Breast cancer Mother   . Coronary artery disease Brother   . Heart disease Maternal Grandfather     Social History  Social History   Social History  . Marital Status: Married    Spouse Name: N/A  . Number of Children: N/A  . Years of Education: N/A   Occupational History  . Not on file.   Social History Main Topics  .  Smoking status: Former Smoker    Types: Cigarettes    Quit date: 04/11/1981  . Smokeless tobacco: Never Used  . Alcohol Use: No  . Drug Use: No  . Sexual Activity: Not on file   Other Topics Concern  . Not on file   Social History Narrative     Review of Systems General:  No chills, fever, night sweats or weight changes.  Cardiovascular:  + chest pain, no dyspnea on exertion, edema, orthopnea, palpitations, paroxysmal nocturnal dyspnea. Dermatological: No rash, lesions/masses Respiratory: No cough, dyspnea Urologic: No hematuria, dysuria Abdominal:   No nausea, vomiting, diarrhea, bright red blood per rectum, melena, or  hematemesis Neurologic:  No visual changes, wkns, changes in mental status. All other systems reviewed and are otherwise negative except as noted above.  Physical Exam  Blood pressure 140/79, pulse 64, temperature 97.8 F (36.6 C), temperature source Oral, resp. rate 18, weight 228 lb 14.4 oz (103.828 kg), SpO2 98 %.  General: Pleasant, NAD Psych: Normal affect. Neuro: Alert and oriented X 3. Moves all extremities spontaneously. HEENT: Normal  Neck: Supple without bruits or JVD. Lungs:  Resp regular and unlabored, CTA. Heart: RRR no s3, s4, or murmurs. Abdomen: Soft, non-tender, non-distended, BS + x 4.  Extremities: No clubbing, cyanosis or edema. DP/PT/Radials 2+ and equal bilaterally.  Labs  Troponin [facility] of Care Test)  Recent Labs  10/13/15 1602  TROPIPOC 0.00    Recent Labs  10/14/15 0131 10/14/15 0602  TROPONINI <0.03 <0.03   Lab Results  Component Value Date   WBC 7.4 10/14/2015   HGB 14.0 10/14/2015   HCT 43.0 10/14/2015   MCV 84.1 10/14/2015   PLT 231 10/14/2015     Recent Labs Lab 10/13/15 1605 10/14/15 0602  NA 137  --   K 3.9  --   CL 104  --   CO2 25  --   BUN 18  --   CREATININE 0.86 0.86  CALCIUM 9.3  --   GLUCOSE 174*  --    No results found for: CHOL, HDL, LDLCALC, TRIG No results found for: DDIMER   Radiology/Studies  Dg Chest 2 View  10/13/2015  CLINICAL DATA:  Left-sided chest pain for 3 days EXAM: CHEST  2 VIEW COMPARISON:  03/23/2011 FINDINGS: Heart size and vascular pattern are normal. Lungs are clear. No consolidation or effusion. Bony thorax intact. IMPRESSION: No active cardiopulmonary disease. Electronically Signed   By: Skipper Cliche M.D.   On: 10/13/2015 16:28    ECG  NSR. No ischemia  ASSESSMENT AND PLAN  Principal Problem:   Chest pain with moderate risk for cardiac etiology Active Problems:   Diabetes mellitus   Hypertension   70 y/o male with multiple cardiac risk factors including NIDDM, HTN, HLD,  remote h/o tobacco use and family h/o CVD (brother with CAD s/p CABG in his 74s and father who died suddenly from AAA rupture), admitted for new onset CP consistent with unstable angina. Cardiac enzymes are negative x 2. EKG shows NSR w/o ischemia. He will need further cardiac w/u to exclude CAD. Keep NPO. Will discuss with Dr. Gwenlyn Found risk stratification with NST vs definitive assessment with LHC today. Continue risk factor modification with medical management of HTN, HLD and DM. Recommend initiation of ASA. RN SL NTG. Restart home statin. Obtain fasting LP for baseline assessment of lipids. Continue antihypertensives for BP control and medical management of DM.   Signed, Lyda Jester, PA-C 10/14/2015, 9:25 AM  Agree with note written  by Ellen Henri  [facility]  Pt admitted with [place]. + CRF. EKG w/o acute changes. Enz neg. Labs OK. Exam benign. Needs cor angio to define anatomy and R/O CAD.  I have reviewed the risks, indications, and alternatives to cardiac catheterization, possible angioplasty, and stenting with the patient. Risks include but are not limited to bleeding, infection, vascular injury, stroke, myocardial infection, arrhythmia, kidney injury, radiation-related injury in the case of prolonged fluoroscopy use, emergency cardiac surgery, and death. The patient understands the risks of serious complication is 1-2 in [id number] with diagnostic cardiac cath and 1-2% or less with angioplasty/stenting.   Quay Burow 10/14/2015 9:53 AM

## 2015-10-14 NOTE — ED Notes (Signed)
Attempted report 

## 2015-10-15 ENCOUNTER — Encounter (HOSPITAL_COMMUNITY): Payer: Self-pay | Admitting: Cardiovascular Disease

## 2015-10-15 DIAGNOSIS — I10 Essential (primary) hypertension: Secondary | ICD-10-CM | POA: Diagnosis not present

## 2015-10-15 DIAGNOSIS — E785 Hyperlipidemia, unspecified: Secondary | ICD-10-CM | POA: Diagnosis not present

## 2015-10-15 DIAGNOSIS — I25119 Atherosclerotic heart disease of native coronary artery with unspecified angina pectoris: Secondary | ICD-10-CM | POA: Diagnosis not present

## 2015-10-15 DIAGNOSIS — R079 Chest pain, unspecified: Secondary | ICD-10-CM | POA: Diagnosis not present

## 2015-10-15 DIAGNOSIS — I25110 Atherosclerotic heart disease of native coronary artery with unstable angina pectoris: Secondary | ICD-10-CM | POA: Diagnosis not present

## 2015-10-15 LAB — BASIC METABOLIC PANEL
Anion gap: 7 (ref 5–15)
BUN: 15 mg/dL (ref 6–20)
CHLORIDE: 108 mmol/L (ref 101–111)
CO2: 24 mmol/L (ref 22–32)
CREATININE: 0.76 mg/dL (ref 0.61–1.24)
Calcium: 8.7 mg/dL — ABNORMAL LOW (ref 8.9–10.3)
GFR calc Af Amer: >60 mL/min (ref >60)
GFR calc non Af Amer: >60 mL/min (ref >60)
Glucose, Bld: 132 mg/dL — ABNORMAL HIGH (ref 65–99)
Potassium: 3.8 mmol/L (ref 3.5–5.1)
Sodium: 139 mmol/L (ref 135–145)

## 2015-10-15 LAB — CBC
HEMATOCRIT: 43 % (ref 39.0–52.0)
HEMOGLOBIN: 14.1 g/dL (ref 13.0–17.0)
MCH: 27.2 pg (ref 26.0–34.0)
MCHC: 32.8 g/dL (ref 30.0–36.0)
MCV: 83 fL (ref 78.0–100.0)
Platelets: 234 10*3/uL (ref 150–400)
RBC: 5.18 MIL/uL (ref 4.22–5.81)
RDW: 12.2 % (ref 11.5–15.5)
WBC: 7.4 10*3/uL (ref 4.0–10.5)

## 2015-10-15 LAB — GLUCOSE, CAPILLARY: GLUCOSE-CAPILLARY: 133 mg/dL — AB (ref 65–99)

## 2015-10-15 MED ORDER — ASPIRIN 81 MG PO TBEC: 81.0000 mg | DELAYED_RELEASE_TABLET | Freq: Every day | ORAL | Status: AC

## 2015-10-15 MED ORDER — ATORVASTATIN CALCIUM 80 MG PO TABS: 80.0000 mg | ORAL_TABLET | Freq: Every evening | ORAL | Status: AC

## 2015-10-15 MED FILL — Verapamil HCl IV Soln 2.5 MG/ML: INTRAVENOUS | Qty: 2 | Status: AC

## 2015-10-15 MED FILL — Nitroglycerin IV Soln 100 MCG/ML in D5W: INTRA_ARTERIAL | Qty: 10 | Status: AC

## 2015-10-15 NOTE — Progress Notes (Signed)
Patient and wife given discharge instructions and all questions answered.  Ignacia Bayley, NP paged about question about starting Beta blocker/nitrate.  Stated to not start now but to follow up with cardiologist in 2-3 weeks outpatient.  Patient discharged via wheelchair with all belongings.

## 2015-10-15 NOTE — Discharge Summary (Signed)
Physician Discharge Summary  Christopher Barnett [id number] DOB: Feb 27, 1946 DOA: 10/14/2015  PCP: No primary care provider on file.  Admit date: 10/14/2015 Discharge date: 10/15/2015  Time spent: > 35 minutes  Recommendations for Outpatient Follow-up:  1. Pt to f/u with cardiologist on discharge.   Discharge Diagnoses:  Principal Problem:   Chest pain with moderate risk for cardiac etiology Active Problems:   Diabetes mellitus   Hypertension   Hyperlipidemia   Discharge Condition: stable  Diet recommendation: heart healthy/low sodium  Filed Weights   10/14/15 0342 10/15/15 0600  Weight: 103.828 kg (228 lb 14.4 oz) 103.284 kg (227 lb 11.2 oz)    History of present illness:  70 y.o. male with a past medical history significant for NIDDM and HTN and obesity who presents with chest pain.  Hospital Course:  Per cardiology recommendations:  1. Chest pain w/ moderate risk for cardiac etiology: S/p cath on 5/23 revealing moderate, nonobs LCX and RCA dzs. No targets for intervention. Cont med Rx  asa/statin. ? outpt MV for recurrent c/p to determine ischemic significance of CAD.  2. Essential HTN: Stable on acei/ccb.  3. HL: LDL 72. Cont statin.  4. Type II DM: Per IM.  Signed, Murray Hodgkins NP  Agree with note by Ignacia Bayley RNP  Mod noncrit CAD by cath. Groin OK. No recurrent CP. On CCB. Can add low dose BB or nitrate. FLP good. OK for DC home. ROV with me 2-3 weeks. If recurrent CP will need OP MV to assess physiologic signif of RCA/LCX disease.  For DM will continue prior to admission medication regimen.  Procedures:  Heart cath. See above  Consultations:  cardiology  Discharge Exam: Filed Vitals:   10/15/15 0900 10/15/15 0950  BP: 129/62 125/68  Pulse: 80 78  Temp:    Resp: 20     General: pt in nad, alert and awake Cardiovascular: rrr, no rubs Respiratory: no increased wob, no wheezes  Discharge Instructions   Discharge  Instructions    Call MD for:  difficulty breathing, headache or visual disturbances    Complete by:  As directed      Call MD for:  severe uncontrolled pain    Complete by:  As directed      Call MD for:  temperature >100.4    Complete by:  As directed      Diet - low sodium heart healthy    Complete by:  As directed      Discharge instructions    Complete by:  As directed   F/u with cardiologist in 2 weeks as per their recommendations. Please call their offices after hospital discharge to confirm appointment date and time.     Increase activity slowly    Complete by:  As directed           Current Discharge Medication List    START taking these medications   Details  aspirin EC 81 MG EC tablet Take 1 tablet (81 mg total) by mouth daily. Qty: 30 tablet, Refills: 0      CONTINUE these medications which have CHANGED   Details  atorvastatin (LIPITOR) 80 MG tablet Take 1 tablet (80 mg total) by mouth every evening. Qty: 30 tablet, Refills: 0      CONTINUE these medications which have NOT CHANGED   Details  amLODipine-benazepril (LOTREL) 5-20 MG per capsule Take 1 capsule by mouth daily.      INVOKAMET [phone] MG TABS Take 1 tablet by mouth 2 (two) times  daily. Refills: 4       Allergies  Allergen Reactions  . Penicillins Rash      The results of significant diagnostics from this hospitalization (including imaging, microbiology, ancillary and laboratory) are listed below for reference.    Significant Diagnostic Studies: Dg Chest 2 View  10/13/2015  CLINICAL DATA:  Left-sided chest pain for 3 days EXAM: CHEST  2 VIEW COMPARISON:  03/23/2011 FINDINGS: Heart size and vascular pattern are normal. Lungs are clear. No consolidation or effusion. Bony thorax intact. IMPRESSION: No active cardiopulmonary disease. Electronically Signed   By: Skipper Cliche M.D.   On: 10/13/2015 16:28    Microbiology: No results found for this or any previous visit (from the past 240  hour(s)).   Labs: Basic Metabolic Panel:  Recent Labs Lab 10/13/15 1605 10/14/15 0602 10/15/15 0320  NA 137  --  139  K 3.9  --  3.8  CL 104  --  108  CO2 25  --  24  GLUCOSE 174*  --  132*  BUN 18  --  15  CREATININE 0.86 0.86 0.76  CALCIUM 9.3  --  8.7*   Liver Function Tests: No results for input(s): AST, ALT, ALKPHOS, BILITOT, PROT, ALBUMIN in the last 168 hours. No results for input(s): LIPASE, AMYLASE in the last 168 hours. No results for input(s): AMMONIA in the last 168 hours. CBC:  Recent Labs Lab 10/13/15 1605 10/14/15 0602 10/15/15 0320  WBC 7.4 7.4 7.4  HGB 15.3 14.0 14.1  HCT 45.4 43.0 43.0  MCV 82.4 84.1 83.0  PLT 269 231 234   Cardiac Enzymes:  Recent Labs Lab 10/14/15 0131 10/14/15 0602  TROPONINI <0.03 <0.03   BNP: BNP (last 3 results) No results for input(s): BNP in the last 8760 hours.  ProBNP (last 3 results) No results for input(s): PROBNP in the last 8760 hours.  CBG:  Recent Labs Lab 10/14/15 1051 10/14/15 1455 10/14/15 1640 10/14/15 2110 10/15/15 0558  GLUCAP 137* 100* 123* 189* 133*       Signed:  Velvet Bathe MD.  [facility] 10/15/2015, 3:17 PM

## 2015-10-15 NOTE — Progress Notes (Signed)
Patient Name: Christopher Barnett Date of Encounter: 10/15/2015  Hospital Problem List     Principal Problem:   Chest pain with moderate risk for cardiac etiology Active Problems:   Diabetes mellitus   Hypertension   Hyperlipidemia    Subjective   No chest pain or sob overnight.  R groin w/o pain.  Has ambulated in the room some w/o difficulty.  Inpatient Medications    . amLODipine  5 mg Oral Daily  . aspirin EC  81 mg Oral Daily  . atorvastatin  80 mg Oral q1800  . benazepril  20 mg Oral Daily  . insulin aspart  1-4 Units Subcutaneous TID WC  . sodium chloride flush  3 mL Intravenous Q12H    Vital Signs    Filed Vitals:   10/15/15 0015 10/15/15 0600 10/15/15 0900 10/15/15 0950  BP: 127/65 113/70 129/62 125/68  Pulse: 80 75 80 78  Temp: 98.5 F (36.9 C) 98.1 F (36.7 C)    TempSrc: Oral Oral    Resp: 18 18 20    Weight:  227 lb 11.2 oz (103.284 kg)    SpO2: 96% 95% 94%     Intake/Output Summary (Last 24 hours) at 10/15/15 1100 Last data filed at 10/15/15 0954  Gross per 24 hour  Intake   1083 ml  Output   1750 ml  Net   -667 ml   Filed Weights   10/14/15 0342 10/15/15 0600  Weight: 228 lb 14.4 oz (103.828 kg) 227 lb 11.2 oz (103.284 kg)    Physical Exam    General: Pleasant, NAD. Neuro: Alert and oriented X 3. Moves all extremities spontaneously. Psych: Normal affect. HEENT:  Normal  Neck: Supple without bruits or JVD. Lungs:  Resp regular and unlabored, CTA. Heart: RRR no s3, s4, or murmurs. Abdomen: Soft, non-tender, non-distended, BS + x 4.  Extremities: No clubbing, cyanosis or edema. DP/PT/Radials 2+ and equal bilaterally.  R groin cath site w/o bleeding/bruit/hematoma.    Labs    CBC  Recent Labs  10/14/15 0602 10/15/15 0320  WBC 7.4 7.4  HGB 14.0 14.1  HCT 43.0 43.0  MCV 84.1 83.0  PLT 231 [id number]   Basic Metabolic Panel  Recent Labs  10/13/15 1605 10/14/15 0602 10/15/15 0320  NA 137  --  139  K 3.9  --  3.8  CL 104  --  108    CO2 25  --  24  GLUCOSE 174*  --  132*  BUN 18  --  15  CREATININE 0.86 0.86 0.76  CALCIUM 9.3  --  8.7*   Cardiac Enzymes  Recent Labs  10/14/15 0131 10/14/15 0602  TROPONINI <0.03 <0.03   Fasting Lipid Panel  Recent Labs  10/14/15 0949  CHOL 151  HDL 39*  LDLCALC 72  TRIG 198*  CHOLHDL 3.9    Telemetry    RSR  Radiology    Dg Chest 2 View  10/13/2015  CLINICAL DATA:  Left-sided chest pain for 3 days EXAM: CHEST  2 VIEW COMPARISON:  03/23/2011 FINDINGS: Heart size and vascular pattern are normal. Lungs are clear. No consolidation or effusion. Bony thorax intact. IMPRESSION: No active cardiopulmonary disease. Electronically Signed   By: Skipper Cliche M.D.   On: 10/13/2015 16:28    Assessment & Plan    1.  Chest pain w/ moderate risk for cardiac etiology:  S/p cath on 5/23 revealing moderate, nonobs LCX and RCA dzs. No targets for intervention.  Cont med Rx  asa/statin.  ? outpt MV for recurrent c/p to determine ischemic significance of CAD.  2.  Essential HTN:  Stable on acei/ccb.  3.  HL:  LDL 72.  Cont statin.  4.  Type II DM:  Per IM.  Signed, Murray Hodgkins NP  Agree with note by Ignacia Bayley RNP  Mod noncrit CAD by cath. Groin OK. No recurrent CP. On CCB. Can add low dose BB or nitrate. FLP good. OK for DC home. ROV with me 2-3 weeks. If recurrent CP will need OP MV to assess physiologic signif of RCA/LCX disease.  Lorretta Harp, M.D., [place], [facility], [place] [address] [place],   [address]  [phone] 10/15/2015 11:41 AM

## 2015-10-31 ENCOUNTER — Ambulatory Visit (INDEPENDENT_AMBULATORY_CARE_PROVIDER_SITE_OTHER): Payer: Medicare Other | Admitting: Cardiovascular Disease

## 2015-10-31 ENCOUNTER — Encounter: Payer: Self-pay | Admitting: Cardiovascular Disease

## 2015-10-31 VITALS — BP 112/70 | HR 80 | Ht 72.0 in | Wt 232.4 lb

## 2015-10-31 DIAGNOSIS — R079 Chest pain, unspecified: Secondary | ICD-10-CM | POA: Diagnosis not present

## 2015-10-31 DIAGNOSIS — E785 Hyperlipidemia, unspecified: Secondary | ICD-10-CM | POA: Diagnosis not present

## 2015-10-31 DIAGNOSIS — I10 Essential (primary) hypertension: Secondary | ICD-10-CM

## 2015-10-31 NOTE — Assessment & Plan Note (Signed)
History of hypertension blood pressure measured today at 112/70. He is on amlodipine and benazepril. Continue current meds at current dosing

## 2015-10-31 NOTE — Assessment & Plan Note (Signed)
History of hyperlipidemia on statin therapy with recent lipid profile performed 10/14/15 revealed a total cholesterol 151, LDL 72 and HDL of 39

## 2015-10-31 NOTE — Assessment & Plan Note (Signed)
Recent admission for chest pain rule out myocardial infarction at [facility] 10/14/15. His enzymes were negative. I performed cardiac catheterization on him through the femoral approach revealing normal LV function with a 50% lesion in the mid AV groove circumflex and mid RCA thought to be not significant. He's had no recurrent symptoms.

## 2015-10-31 NOTE — Progress Notes (Signed)
10/31/2015 Redmond Baseman   1945/07/26  [id number]  Primary Physician No PCP Per Patient Primary Cardiologist: Lorretta Harp MD Renae Gloss  HPI:  Mr. Gustafson is a 70 year old woman with a history of hypertension, hyperlipidemia, diabetes and remote tobacco abuse who was admitted with unstable angina 10/14/15.Marland Kitchen His EKG showed no acute changes. His enzymes were negative. He underwent cardiac catheterization via the right femoral approach revealing normal LV function with a 50% proximal AV groove circumflex and mid dominant RCA stenosis thought not to be hemodynamically significant. He was discharged on the next day. He's had no recurrent symptoms.    Current Outpatient Prescriptions  Medication Sig Dispense Refill  . amLODipine-benazepril (LOTREL) 5-20 MG capsule Take 2 capsules by mouth daily.    Marland Kitchen aspirin EC 81 MG EC tablet Take 1 tablet (81 mg total) by mouth daily. 30 tablet 0  . atorvastatin (LIPITOR) 80 MG tablet Take 1 tablet (80 mg total) by mouth every evening. 30 tablet 0  . INVOKAMET [phone] MG TABS Take 1 tablet by mouth 2 (two) times daily.  4  . sildenafil (VIAGRA) 100 MG tablet Take 100 mg by mouth as needed for erectile dysfunction.     No current facility-administered medications for this visit.    Allergies  Allergen Reactions  . Penicillins Rash    Social History   Social History  . Marital Status: Married    Spouse Name: N/A  . Number of Children: N/A  . Years of Education: N/A   Occupational History  . Not on file.   Social History Main Topics  . Smoking status: Former Smoker    Types: Cigarettes    Quit date: 04/11/1981  . Smokeless tobacco: Never Used  . Alcohol Use: No  . Drug Use: No  . Sexual Activity: Not on file   Other Topics Concern  . Not on file   Social History Narrative     Review of Systems: General: negative for chills, fever, night sweats or weight changes.  Cardiovascular: negative for chest pain,  dyspnea on exertion, edema, orthopnea, palpitations, paroxysmal nocturnal dyspnea or shortness of breath Dermatological: negative for rash Respiratory: negative for cough or wheezing Urologic: negative for hematuria Abdominal: negative for nausea, vomiting, diarrhea, bright red blood per rectum, melena, or hematemesis Neurologic: negative for visual changes, syncope, or dizziness All other systems reviewed and are otherwise negative except as noted above.    Blood pressure 112/70, pulse 80, height 6' (1.829 m), weight 232 lb 6.4 oz (105.416 kg).  General appearance: alert and no distress Neck: no adenopathy, no carotid bruit, no JVD, supple, symmetrical, trachea midline and thyroid not enlarged, symmetric, no tenderness/mass/nodules Lungs: clear to auscultation bilaterally Heart: regular rate and rhythm, S1, S2 normal, no murmur, click, rub or gallop Extremities: extremities normal, atraumatic, no cyanosis or edema  EKG not performed today  ASSESSMENT AND PLAN:   Hypertension History of hypertension blood pressure measured today at 112/70. He is on amlodipine and benazepril. Continue current meds at current dosing  Hyperlipidemia History of hyperlipidemia on statin therapy with recent lipid profile performed 10/14/15 revealed a total cholesterol 151, LDL 72 and HDL of 39  Chest pain with moderate risk for cardiac etiology Recent admission for chest pain rule out myocardial infarction at [facility] 10/14/15. His enzymes were negative. I performed cardiac catheterization on him through the femoral approach revealing normal LV function with a 50% lesion in the mid AV groove circumflex and mid  RCA thought to be not significant. He's had no recurrent symptoms.      Lorretta Harp MD FACP,FACC,FAHA, [facility] 10/31/2015 12:06 PM

## 2015-10-31 NOTE — Patient Instructions (Signed)
Medication Instructions:  Your physician recommends that you continue on your current medications as directed. Please refer to the Current Medication list given to you today.   Labwork: NONE  Testing/Procedures: NONE  Follow-Up: Your physician recommends that you FOLLOW UP ON AN AS NEEDED BASIS. [phone]. Any Other Special Instructions Will Be Listed Below (If Applicable).     If you need a refill on your cardiac medications before your next appointment, please call your pharmacy.

## 2015-10-31 NOTE — Progress Notes (Deleted)
     10/31/2015 Christopher Barnett   [birth year]  [id number]  Primary Physician No PCP Per Patient Primary Cardiologist: Lorretta Harp MD Renae Gloss  HPI:  ***   Current Outpatient Prescriptions  Medication Sig Dispense Refill  . amLODipine-benazepril (LOTREL) 5-20 MG capsule Take 2 capsules by mouth daily.    Marland Kitchen aspirin EC 81 MG EC tablet Take 1 tablet (81 mg total) by mouth daily. 30 tablet 0  . atorvastatin (LIPITOR) 80 MG tablet Take 1 tablet (80 mg total) by mouth every evening. 30 tablet 0  . INVOKAMET [phone] MG TABS Take 1 tablet by mouth 2 (two) times daily.  4  . sildenafil (VIAGRA) 100 MG tablet Take 100 mg by mouth as needed for erectile dysfunction.     No current facility-administered medications for this visit.    Allergies  Allergen Reactions  . Penicillins Rash    Social History   Social History  . Marital Status: Married    Spouse Name: N/A  . Number of Children: N/A  . Years of Education: N/A   Occupational History  . Not on file.   Social History Main Topics  . Smoking status: Former Smoker    Types: Cigarettes    Quit date: 04/11/1981  . Smokeless tobacco: Never Used  . Alcohol Use: No  . Drug Use: No  . Sexual Activity: Not on file   Other Topics Concern  . Not on file   Social History Narrative     Review of Systems: General: negative for chills, fever, night sweats or weight changes.  Cardiovascular: negative for chest pain, dyspnea on exertion, edema, orthopnea, palpitations, paroxysmal nocturnal dyspnea or shortness of breath Dermatological: negative for rash Respiratory: negative for cough or wheezing Urologic: negative for hematuria Abdominal: negative for nausea, vomiting, diarrhea, bright red blood per rectum, melena, or hematemesis Neurologic: negative for visual changes, syncope, or dizziness All other systems reviewed and are otherwise negative except as noted above.    Blood pressure 112/70, pulse 80, height  6' (1.829 m), weight 232 lb 6.4 oz (105.416 kg).  {Physical XB:[id number]  EKG ***  ASSESSMENT AND PLAN:   Hypertension History of hypertension blood pressure measured today at 112/70. He is on amlodipine and benazepril. Continue current meds at current dosing  Hyperlipidemia History of hyperlipidemia on statin therapy with recent lipid profile performed 10/14/15 revealed a total cholesterol 151, LDL 72 and HDL of 39  Chest pain with moderate risk for cardiac etiology Recent admission for chest pain rule out myocardial infarction at [facility] 10/14/15. His enzymes were negative. I performed cardiac catheterization on him through the femoral approach revealing normal LV function with a 50% lesion in the mid AV groove circumflex and mid RCA thought to be not significant. He's had no recurrent symptoms.      Lorretta Harp MD FACP,FACC,FAHA, [facility] 10/31/2015 12:06 PM

## 2015-12-16 DIAGNOSIS — K5730 Diverticulosis of large intestine without perforation or abscess without bleeding: Secondary | ICD-10-CM | POA: Diagnosis not present

## 2015-12-16 DIAGNOSIS — Z86010 Personal history of colonic polyps: Secondary | ICD-10-CM | POA: Diagnosis not present

## 2015-12-26 DIAGNOSIS — I25119 Atherosclerotic heart disease of native coronary artery with unspecified angina pectoris: Secondary | ICD-10-CM | POA: Diagnosis not present

## 2015-12-26 DIAGNOSIS — I10 Essential (primary) hypertension: Secondary | ICD-10-CM | POA: Diagnosis not present

## 2015-12-26 DIAGNOSIS — Z6831 Body mass index (BMI) 31.0-31.9, adult: Secondary | ICD-10-CM | POA: Diagnosis not present

## 2015-12-26 DIAGNOSIS — E784 Other hyperlipidemia: Secondary | ICD-10-CM | POA: Diagnosis not present

## 2015-12-26 DIAGNOSIS — Z23 Encounter for immunization: Secondary | ICD-10-CM | POA: Diagnosis not present

## 2015-12-26 DIAGNOSIS — E119 Type 2 diabetes mellitus without complications: Secondary | ICD-10-CM | POA: Diagnosis not present

## 2016-01-14 DIAGNOSIS — E784 Other hyperlipidemia: Secondary | ICD-10-CM | POA: Diagnosis not present

## 2016-02-24 DIAGNOSIS — Z23 Encounter for immunization: Secondary | ICD-10-CM | POA: Diagnosis not present

## 2016-04-26 DIAGNOSIS — E1151 Type 2 diabetes mellitus with diabetic peripheral angiopathy without gangrene: Secondary | ICD-10-CM | POA: Diagnosis not present

## 2016-04-26 DIAGNOSIS — E668 Other obesity: Secondary | ICD-10-CM | POA: Diagnosis not present

## 2016-04-26 DIAGNOSIS — I25119 Atherosclerotic heart disease of native coronary artery with unspecified angina pectoris: Secondary | ICD-10-CM | POA: Diagnosis not present

## 2016-04-26 DIAGNOSIS — E784 Other hyperlipidemia: Secondary | ICD-10-CM | POA: Diagnosis not present

## 2016-04-26 DIAGNOSIS — Z6831 Body mass index (BMI) 31.0-31.9, adult: Secondary | ICD-10-CM | POA: Diagnosis not present

## 2016-04-26 DIAGNOSIS — I10 Essential (primary) hypertension: Secondary | ICD-10-CM | POA: Diagnosis not present

## 2016-05-04 DIAGNOSIS — D2271 Melanocytic nevi of right lower limb, including hip: Secondary | ICD-10-CM | POA: Diagnosis not present

## 2016-05-04 DIAGNOSIS — D2262 Melanocytic nevi of left upper limb, including shoulder: Secondary | ICD-10-CM | POA: Diagnosis not present

## 2016-05-04 DIAGNOSIS — L821 Other seborrheic keratosis: Secondary | ICD-10-CM | POA: Diagnosis not present

## 2016-05-04 DIAGNOSIS — D225 Melanocytic nevi of trunk: Secondary | ICD-10-CM | POA: Diagnosis not present

## 2016-05-04 DIAGNOSIS — Z85828 Personal history of other malignant neoplasm of skin: Secondary | ICD-10-CM | POA: Diagnosis not present

## 2016-05-04 DIAGNOSIS — L814 Other melanin hyperpigmentation: Secondary | ICD-10-CM | POA: Diagnosis not present

## 2016-05-04 DIAGNOSIS — D2261 Melanocytic nevi of right upper limb, including shoulder: Secondary | ICD-10-CM | POA: Diagnosis not present

## 2016-06-17 DIAGNOSIS — E119 Type 2 diabetes mellitus without complications: Secondary | ICD-10-CM | POA: Diagnosis not present

## 2016-07-07 DIAGNOSIS — Z8546 Personal history of malignant neoplasm of prostate: Secondary | ICD-10-CM | POA: Diagnosis not present

## 2016-07-14 DIAGNOSIS — N5201 Erectile dysfunction due to arterial insufficiency: Secondary | ICD-10-CM | POA: Diagnosis not present

## 2016-07-14 DIAGNOSIS — R350 Frequency of micturition: Secondary | ICD-10-CM | POA: Diagnosis not present

## 2016-07-14 DIAGNOSIS — N400 Enlarged prostate without lower urinary tract symptoms: Secondary | ICD-10-CM | POA: Diagnosis not present

## 2016-07-14 DIAGNOSIS — Z8546 Personal history of malignant neoplasm of prostate: Secondary | ICD-10-CM | POA: Diagnosis not present

## 2016-08-23 DIAGNOSIS — Z125 Encounter for screening for malignant neoplasm of prostate: Secondary | ICD-10-CM | POA: Diagnosis not present

## 2016-08-23 DIAGNOSIS — E119 Type 2 diabetes mellitus without complications: Secondary | ICD-10-CM | POA: Diagnosis not present

## 2016-08-23 DIAGNOSIS — E784 Other hyperlipidemia: Secondary | ICD-10-CM | POA: Diagnosis not present

## 2016-08-23 DIAGNOSIS — I10 Essential (primary) hypertension: Secondary | ICD-10-CM | POA: Diagnosis not present

## 2016-08-30 DIAGNOSIS — C61 Malignant neoplasm of prostate: Secondary | ICD-10-CM | POA: Diagnosis not present

## 2016-08-30 DIAGNOSIS — Z6831 Body mass index (BMI) 31.0-31.9, adult: Secondary | ICD-10-CM | POA: Diagnosis not present

## 2016-08-30 DIAGNOSIS — I10 Essential (primary) hypertension: Secondary | ICD-10-CM | POA: Diagnosis not present

## 2016-08-30 DIAGNOSIS — Z1389 Encounter for screening for other disorder: Secondary | ICD-10-CM | POA: Diagnosis not present

## 2016-08-30 DIAGNOSIS — M1990 Unspecified osteoarthritis, unspecified site: Secondary | ICD-10-CM | POA: Diagnosis not present

## 2016-08-30 DIAGNOSIS — I25119 Atherosclerotic heart disease of native coronary artery with unspecified angina pectoris: Secondary | ICD-10-CM | POA: Diagnosis not present

## 2016-08-30 DIAGNOSIS — E784 Other hyperlipidemia: Secondary | ICD-10-CM | POA: Diagnosis not present

## 2016-08-30 DIAGNOSIS — Z0000 Encounter for general adult medical examination without abnormal findings: Secondary | ICD-10-CM | POA: Diagnosis not present

## 2016-08-30 DIAGNOSIS — E669 Obesity, unspecified: Secondary | ICD-10-CM | POA: Diagnosis not present

## 2016-08-30 DIAGNOSIS — E1151 Type 2 diabetes mellitus with diabetic peripheral angiopathy without gangrene: Secondary | ICD-10-CM | POA: Diagnosis not present

## 2016-09-06 DIAGNOSIS — Z1212 Encounter for screening for malignant neoplasm of rectum: Secondary | ICD-10-CM | POA: Diagnosis not present

## 2016-12-02 DIAGNOSIS — R002 Palpitations: Secondary | ICD-10-CM | POA: Diagnosis not present

## 2016-12-02 DIAGNOSIS — E669 Obesity, unspecified: Secondary | ICD-10-CM | POA: Diagnosis not present

## 2016-12-02 DIAGNOSIS — I10 Essential (primary) hypertension: Secondary | ICD-10-CM | POA: Diagnosis not present

## 2016-12-02 DIAGNOSIS — E1151 Type 2 diabetes mellitus with diabetic peripheral angiopathy without gangrene: Secondary | ICD-10-CM | POA: Diagnosis not present

## 2016-12-02 DIAGNOSIS — I25119 Atherosclerotic heart disease of native coronary artery with unspecified angina pectoris: Secondary | ICD-10-CM | POA: Diagnosis not present

## 2016-12-02 DIAGNOSIS — M1990 Unspecified osteoarthritis, unspecified site: Secondary | ICD-10-CM | POA: Diagnosis not present

## 2016-12-02 DIAGNOSIS — Z6832 Body mass index (BMI) 32.0-32.9, adult: Secondary | ICD-10-CM | POA: Diagnosis not present

## 2016-12-22 ENCOUNTER — Encounter (INDEPENDENT_AMBULATORY_CARE_PROVIDER_SITE_OTHER): Payer: Medicare Other

## 2016-12-22 DIAGNOSIS — R002 Palpitations: Secondary | ICD-10-CM

## 2017-02-14 ENCOUNTER — Telehealth: Payer: Self-pay | Admitting: Cardiovascular Disease

## 2017-02-14 NOTE — Telephone Encounter (Signed)
Spoke w wife. Dr. Dagmar Hait had ordered a heart monitor this summer, which patient finished wearing ~1 month ago.   They got interpretation of results, looks like he had episodes of ST w occasional PVCs. He was recently put on metoprolol by Dr. Dagmar Hait and has f/u on Thursday w APP at [facility]. Wife wanted to see if anything else advised, or if patient needs return OV by Dr. Gwenlyn Found. Per last OV note, pt had been instructed to f/u as needed w Dr. Gwenlyn Found, but this was over 1 year ago and monitor was done recently. Informed wife I would route to provider for recommendations.

## 2017-02-14 NOTE — Telephone Encounter (Signed)
New Message  Pt wife call requesting to speak with RN. Pt wife states pt has been having rapid heart rate for the past couple of months. She states she would like pts monitor to be reviewed and called back to discuss.

## 2017-02-16 NOTE — Telephone Encounter (Signed)
I would be happy to follow as OP

## 2017-02-17 DIAGNOSIS — Z23 Encounter for immunization: Secondary | ICD-10-CM | POA: Diagnosis not present

## 2017-02-17 DIAGNOSIS — Z6832 Body mass index (BMI) 32.0-32.9, adult: Secondary | ICD-10-CM | POA: Diagnosis not present

## 2017-02-17 DIAGNOSIS — E785 Hyperlipidemia, unspecified: Secondary | ICD-10-CM | POA: Diagnosis not present

## 2017-02-17 DIAGNOSIS — I10 Essential (primary) hypertension: Secondary | ICD-10-CM | POA: Diagnosis not present

## 2017-02-17 DIAGNOSIS — R002 Palpitations: Secondary | ICD-10-CM | POA: Diagnosis not present

## 2017-02-18 ENCOUNTER — Telehealth: Payer: Self-pay | Admitting: Cardiovascular Disease

## 2017-02-18 NOTE — Telephone Encounter (Signed)
Message sent to scheduling to arrange appointment  

## 2017-02-18 NOTE — Telephone Encounter (Signed)
Called and left a VM to have the patient call back to make followup appt with Dr. Gwenlyn Found.  Last seen 10-2015.

## 2017-02-22 NOTE — Telephone Encounter (Signed)
Appointment scheduled with Dr. Gwenlyn Found on 10/31.

## 2017-03-23 ENCOUNTER — Encounter: Payer: Self-pay | Admitting: Cardiovascular Disease

## 2017-03-23 ENCOUNTER — Ambulatory Visit (INDEPENDENT_AMBULATORY_CARE_PROVIDER_SITE_OTHER): Payer: Medicare Other | Admitting: Cardiovascular Disease

## 2017-03-23 DIAGNOSIS — R002 Palpitations: Secondary | ICD-10-CM | POA: Diagnosis not present

## 2017-03-23 DIAGNOSIS — E7800 Pure hypercholesterolemia, unspecified: Secondary | ICD-10-CM

## 2017-03-23 DIAGNOSIS — R079 Chest pain, unspecified: Secondary | ICD-10-CM

## 2017-03-23 DIAGNOSIS — I10 Essential (primary) hypertension: Secondary | ICD-10-CM

## 2017-03-23 NOTE — Assessment & Plan Note (Signed)
History of palpitations with event monitor that showed sinus rhythm/sinus tachycardia with PVCs. These have improved at the addition of metoprolol.

## 2017-03-23 NOTE — Assessment & Plan Note (Signed)
History of essential hypertension blood pressure measured at 112/70. He is on metoprolol, amlodipine and benazepril. Continue current measures and current dosing

## 2017-03-23 NOTE — Progress Notes (Signed)
03/23/2017 Christopher Barnett   01-01-1946  [id number]  Primary Physician Prince Solian, MD Primary Cardiologist: Lorretta Harp MD Lupe Carney, [place]  HPI:  Bradie Lacock is a 71 y.o. male with a history of hypertension, hyperlipidemia, diabetes and remote tobacco abuse . I last saw him in the office 10/31/15. He  was admitted with unstable angina 10/14/15. His EKG showed no acute changes. His enzymes were negative. He underwent cardiac catheterization via the right femoral approach revealing normal LV function with a 50% proximal AV groove circumflex and mid dominant RCA stenosis thought not to be hemodynamically significant. He was discharged on the next day. He's had no recurrent symptoms. He also complained of some palpitations and an event monitor performed on 12/22/16 showed sinus rhythm/sinus tachycardia with PVCs which have improved since the addition of metoprolol.   Current Meds  Medication Sig  . amLODipine-benazepril (LOTREL) 5-20 MG capsule Take 2 capsules by mouth daily.  Marland Kitchen aspirin EC 81 MG EC tablet Take 1 tablet (81 mg total) by mouth daily.  Marland Kitchen atorvastatin (LIPITOR) 80 MG tablet Take 1 tablet (80 mg total) by mouth every evening.  . INVOKAMET [phone] MG TABS Take 1 tablet by mouth 2 (two) times daily.  . metoprolol tartrate (LOPRESSOR) 50 MG tablet Take 1 tablet by mouth daily.  . sildenafil (VIAGRA) 100 MG tablet Take 100 mg by mouth as needed for erectile dysfunction.     Allergies  Allergen Reactions  . Penicillins Rash    Social History   Social History  . Marital status: Married    Spouse name: N/A  . Number of children: N/A  . Years of education: N/A   Occupational History  . Not on file.   Social History Main Topics  . Smoking status: Former Smoker    Types: Cigarettes    Quit date: 04/11/1981  . Smokeless tobacco: Never Used  . Alcohol use No  . Drug use: No  . Sexual activity: Not on file   Other Topics Concern  . Not on file    Social History Narrative  . No narrative on file     Review of Systems: General: negative for chills, fever, night sweats or weight changes.  Cardiovascular: negative for chest pain, dyspnea on exertion, edema, orthopnea, palpitations, paroxysmal nocturnal dyspnea or shortness of breath Dermatological: negative for rash Respiratory: negative for cough or wheezing Urologic: negative for hematuria Abdominal: negative for nausea, vomiting, diarrhea, bright red blood per rectum, melena, or hematemesis Neurologic: negative for visual changes, syncope, or dizziness All other systems reviewed and are otherwise negative except as noted above.    Blood pressure 110/62, pulse 71, height 6' (1.829 m), weight 236 lb 9.6 oz (107.3 kg).  General appearance: alert and no distress Neck: no adenopathy, no carotid bruit, no JVD, supple, symmetrical, trachea midline and thyroid not enlarged, symmetric, no tenderness/mass/nodules Lungs: clear to auscultation bilaterally Heart: regular rate and rhythm, S1, S2 normal, no murmur, click, rub or gallop Extremities: extremities normal, atraumatic, no cyanosis or edema Pulses: 2+ and symmetric Skin: Skin color, texture, turgor normal. No rashes or lesions Neurologic: Alert and oriented X 3, normal strength and tone. Normal symmetric reflexes. Normal coordination and gait  EKG sinus rhythm at 71 without ST or T-wave changes. I personally reviewed this EKG  ASSESSMENT AND PLAN:   Chest pain with moderate risk for cardiac etiology History of chest pain with cardiac catheterization performed by myself 10/14/15 revealing normal LV systolic function or  RCA percent lesion and mid AV groove circumflex 50% lesion which did not appear to be physiologically significant. He no longer complains of chest pain.  Hypertension History of essential hypertension blood pressure measured at 112/70. He is on metoprolol, amlodipine and benazepril. Continue current measures and  current dosing  Hyperlipidemia History of hyperlipidemia on statin therapy followed by his PCP  Palpitations History of palpitations with event monitor that showed sinus rhythm/sinus tachycardia with PVCs. These have improved at the addition of metoprolol.      Lorretta Harp MD FACP,FACC,FAHA, [facility] 03/23/2017 10:10 AM

## 2017-03-23 NOTE — Assessment & Plan Note (Signed)
History of chest pain with cardiac catheterization performed by myself 10/14/15 revealing normal LV systolic function or RCA percent lesion and mid AV groove circumflex 50% lesion which did not appear to be physiologically significant. He no longer complains of chest pain.

## 2017-03-23 NOTE — Patient Instructions (Signed)
Medication Instructions: Your physician recommends that you continue on your current medications as directed. Please refer to the Current Medication list given to you today.  Labwork: I will request recent blood work from Dr. Danna Hefty office.   Follow-Up: Your physician wants you to follow-up in: 1 year with Dr. Gwenlyn Found. You will receive a reminder letter in the mail two months in advance. If you don't receive a letter, please call our office to schedule the follow-up appointment.  If you need a refill on your cardiac medications before your next appointment, please call your pharmacy.

## 2017-03-23 NOTE — Addendum Note (Signed)
Addended by: Zebedee Iba on: 03/23/2017 10:44 AM   Modules accepted: Orders

## 2017-03-23 NOTE — Assessment & Plan Note (Signed)
History of hyperlipidemia on statin therapy followed by his PCP 

## 2017-03-31 DIAGNOSIS — I10 Essential (primary) hypertension: Secondary | ICD-10-CM | POA: Diagnosis not present

## 2017-03-31 DIAGNOSIS — R300 Dysuria: Secondary | ICD-10-CM | POA: Diagnosis not present

## 2017-03-31 DIAGNOSIS — R319 Hematuria, unspecified: Secondary | ICD-10-CM | POA: Diagnosis not present

## 2017-03-31 DIAGNOSIS — E1151 Type 2 diabetes mellitus with diabetic peripheral angiopathy without gangrene: Secondary | ICD-10-CM | POA: Diagnosis not present

## 2017-03-31 DIAGNOSIS — R350 Frequency of micturition: Secondary | ICD-10-CM | POA: Diagnosis not present

## 2017-03-31 DIAGNOSIS — R3129 Other microscopic hematuria: Secondary | ICD-10-CM | POA: Diagnosis not present

## 2017-03-31 DIAGNOSIS — Z6832 Body mass index (BMI) 32.0-32.9, adult: Secondary | ICD-10-CM | POA: Diagnosis not present

## 2017-04-07 DIAGNOSIS — I10 Essential (primary) hypertension: Secondary | ICD-10-CM | POA: Diagnosis not present

## 2017-04-07 DIAGNOSIS — R319 Hematuria, unspecified: Secondary | ICD-10-CM | POA: Diagnosis not present

## 2017-04-07 DIAGNOSIS — Z6831 Body mass index (BMI) 31.0-31.9, adult: Secondary | ICD-10-CM | POA: Diagnosis not present

## 2017-04-07 DIAGNOSIS — E1151 Type 2 diabetes mellitus with diabetic peripheral angiopathy without gangrene: Secondary | ICD-10-CM | POA: Diagnosis not present

## 2017-04-07 DIAGNOSIS — R350 Frequency of micturition: Secondary | ICD-10-CM | POA: Diagnosis not present

## 2017-04-07 DIAGNOSIS — E668 Other obesity: Secondary | ICD-10-CM | POA: Diagnosis not present

## 2017-04-07 DIAGNOSIS — R3129 Other microscopic hematuria: Secondary | ICD-10-CM | POA: Diagnosis not present

## 2017-05-04 DIAGNOSIS — Z85828 Personal history of other malignant neoplasm of skin: Secondary | ICD-10-CM | POA: Diagnosis not present

## 2017-05-04 DIAGNOSIS — D225 Melanocytic nevi of trunk: Secondary | ICD-10-CM | POA: Diagnosis not present

## 2017-05-04 DIAGNOSIS — D692 Other nonthrombocytopenic purpura: Secondary | ICD-10-CM | POA: Diagnosis not present

## 2017-05-04 DIAGNOSIS — L814 Other melanin hyperpigmentation: Secondary | ICD-10-CM | POA: Diagnosis not present

## 2017-05-04 DIAGNOSIS — D1801 Hemangioma of skin and subcutaneous tissue: Secondary | ICD-10-CM | POA: Diagnosis not present

## 2017-05-04 DIAGNOSIS — D2261 Melanocytic nevi of right upper limb, including shoulder: Secondary | ICD-10-CM | POA: Diagnosis not present

## 2017-05-04 DIAGNOSIS — D2239 Melanocytic nevi of other parts of face: Secondary | ICD-10-CM | POA: Diagnosis not present

## 2017-05-04 DIAGNOSIS — L821 Other seborrheic keratosis: Secondary | ICD-10-CM | POA: Diagnosis not present

## 2017-05-04 DIAGNOSIS — B353 Tinea pedis: Secondary | ICD-10-CM | POA: Diagnosis not present

## 2017-05-04 DIAGNOSIS — D485 Neoplasm of uncertain behavior of skin: Secondary | ICD-10-CM | POA: Diagnosis not present

## 2017-05-04 DIAGNOSIS — D2271 Melanocytic nevi of right lower limb, including hip: Secondary | ICD-10-CM | POA: Diagnosis not present

## 2017-05-04 DIAGNOSIS — D2262 Melanocytic nevi of left upper limb, including shoulder: Secondary | ICD-10-CM | POA: Diagnosis not present

## 2017-05-10 IMAGING — CR DG CHEST 2V
2 series · 2 of 2 positions shown · non-contrast
Comparison: 03/23/2011

CLINICAL DATA: Left-sided chest pain for 3 days

EXAM:
CHEST  2 VIEW

[chest pa]
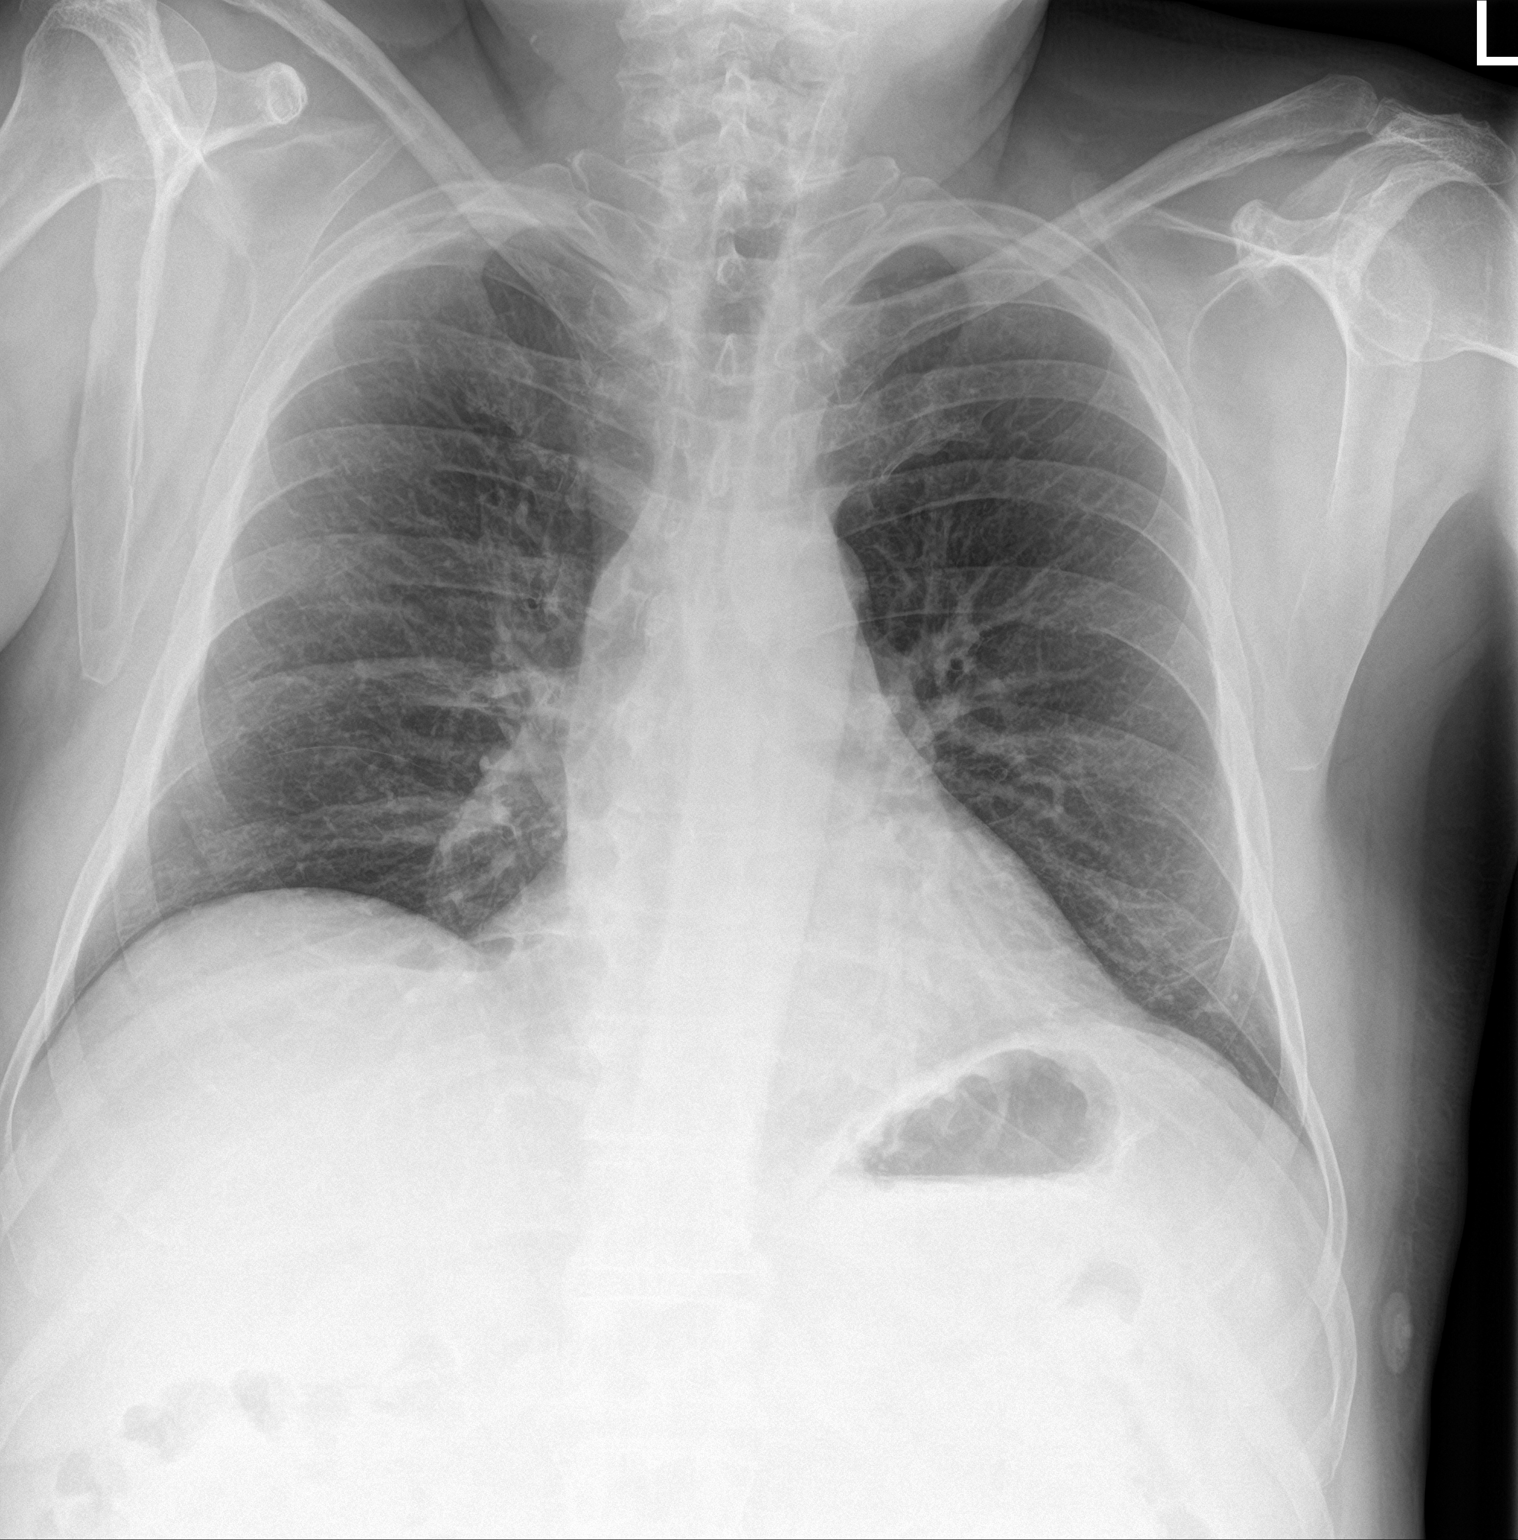

[chest lat]
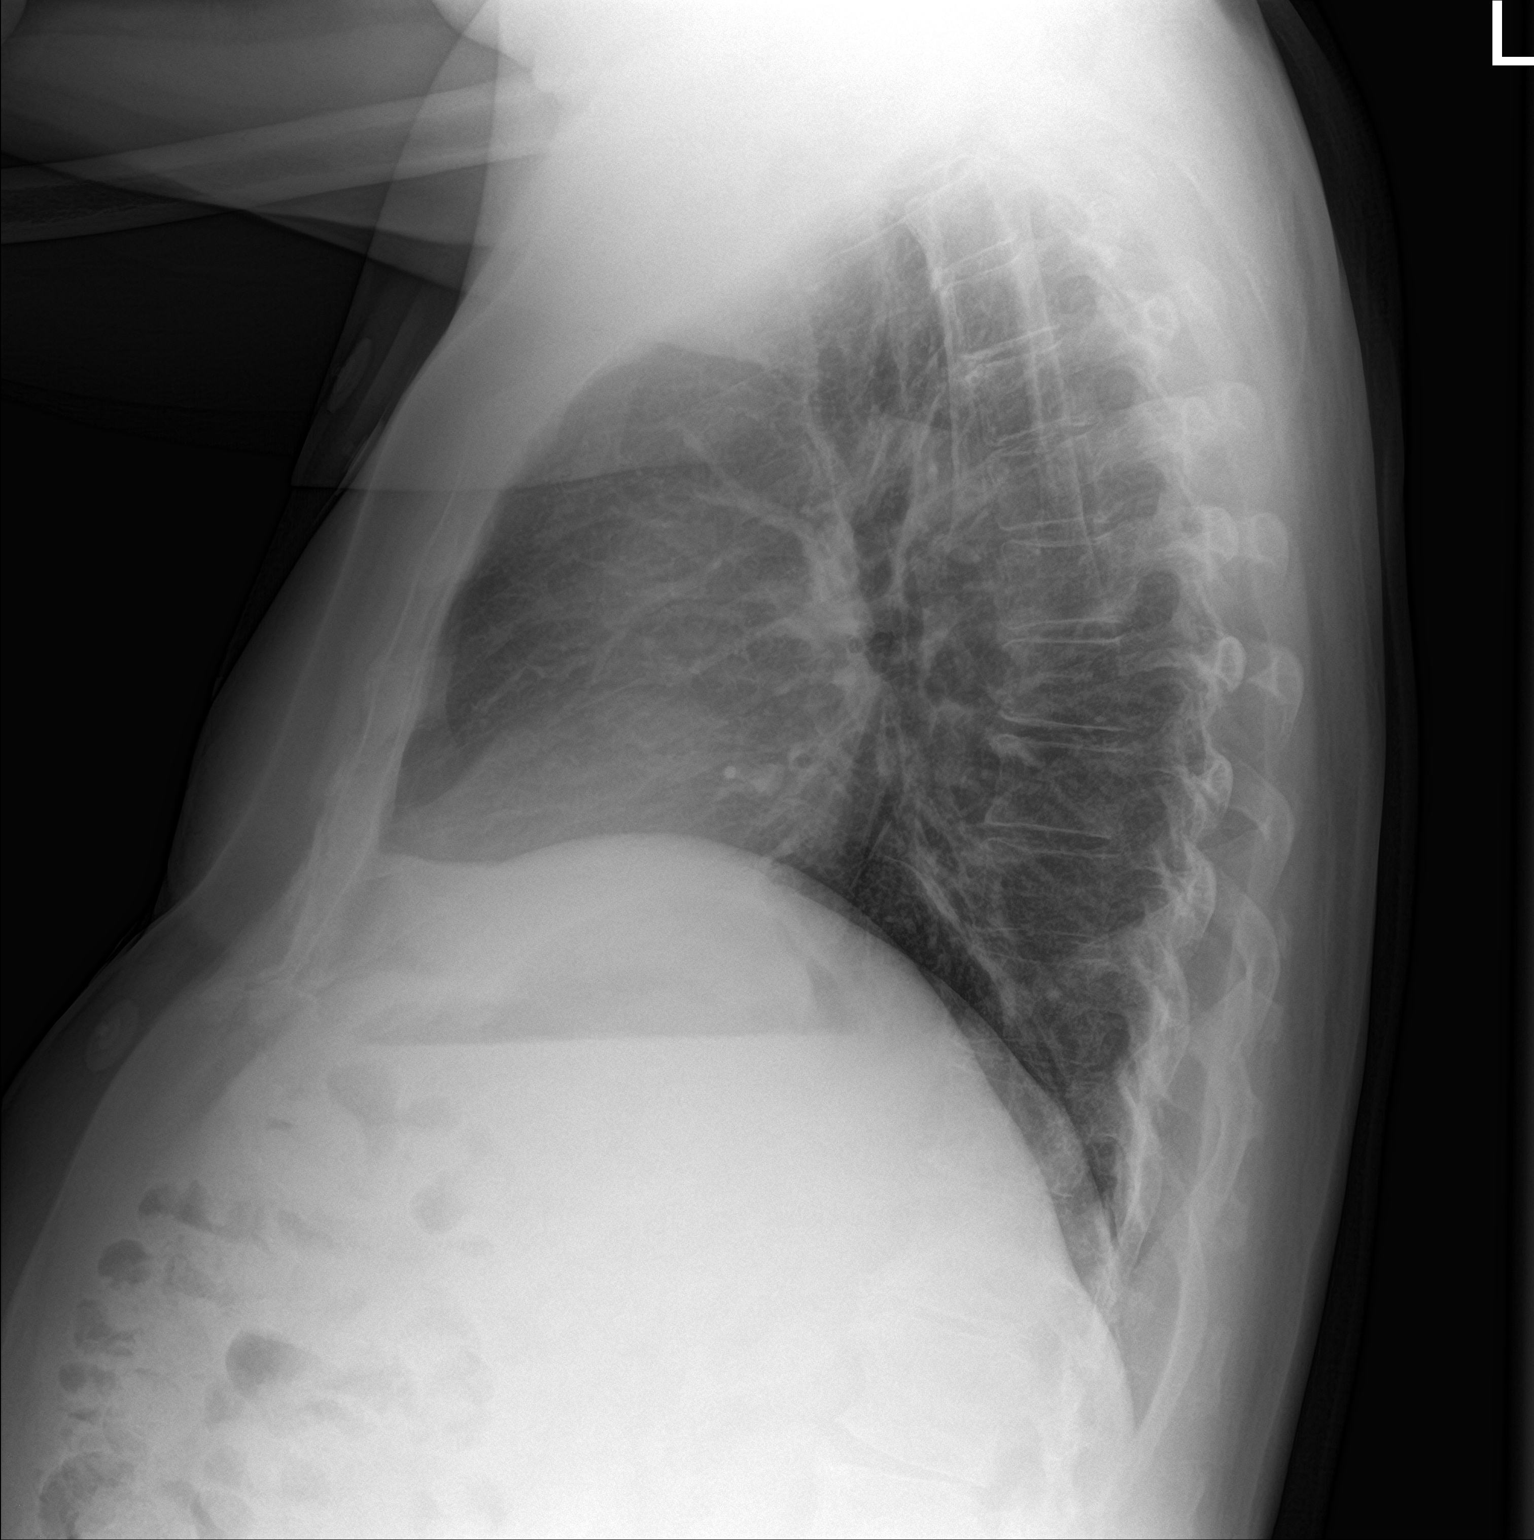

[2 of 2 positions shown; findings below may reference images not displayed]

FINDINGS: Heart size and vascular pattern are normal. Lungs are clear. No
consolidation or effusion. Bony thorax intact.
IMPRESSION: No active cardiopulmonary disease.

## 2017-06-02 DIAGNOSIS — L988 Other specified disorders of the skin and subcutaneous tissue: Secondary | ICD-10-CM | POA: Diagnosis not present

## 2017-06-02 DIAGNOSIS — D485 Neoplasm of uncertain behavior of skin: Secondary | ICD-10-CM | POA: Diagnosis not present

## 2017-07-06 DIAGNOSIS — Z6831 Body mass index (BMI) 31.0-31.9, adult: Secondary | ICD-10-CM | POA: Diagnosis not present

## 2017-07-06 DIAGNOSIS — E1165 Type 2 diabetes mellitus with hyperglycemia: Secondary | ICD-10-CM | POA: Diagnosis not present

## 2017-07-06 DIAGNOSIS — I10 Essential (primary) hypertension: Secondary | ICD-10-CM | POA: Diagnosis not present

## 2017-07-06 DIAGNOSIS — E669 Obesity, unspecified: Secondary | ICD-10-CM | POA: Diagnosis not present

## 2017-07-08 DIAGNOSIS — E119 Type 2 diabetes mellitus without complications: Secondary | ICD-10-CM | POA: Diagnosis not present

## 2017-07-11 DIAGNOSIS — C61 Malignant neoplasm of prostate: Secondary | ICD-10-CM | POA: Diagnosis not present

## 2017-07-18 DIAGNOSIS — N5201 Erectile dysfunction due to arterial insufficiency: Secondary | ICD-10-CM | POA: Diagnosis not present

## 2017-07-18 DIAGNOSIS — R350 Frequency of micturition: Secondary | ICD-10-CM | POA: Diagnosis not present

## 2017-07-18 DIAGNOSIS — C61 Malignant neoplasm of prostate: Secondary | ICD-10-CM | POA: Diagnosis not present

## 2017-08-24 DIAGNOSIS — L821 Other seborrheic keratosis: Secondary | ICD-10-CM | POA: Diagnosis not present

## 2017-08-24 DIAGNOSIS — L820 Inflamed seborrheic keratosis: Secondary | ICD-10-CM | POA: Diagnosis not present

## 2017-08-24 DIAGNOSIS — Z85828 Personal history of other malignant neoplasm of skin: Secondary | ICD-10-CM | POA: Diagnosis not present

## 2017-09-08 DIAGNOSIS — Z125 Encounter for screening for malignant neoplasm of prostate: Secondary | ICD-10-CM | POA: Diagnosis not present

## 2017-09-08 DIAGNOSIS — R82998 Other abnormal findings in urine: Secondary | ICD-10-CM | POA: Diagnosis not present

## 2017-09-08 DIAGNOSIS — E7849 Other hyperlipidemia: Secondary | ICD-10-CM | POA: Diagnosis not present

## 2017-09-08 DIAGNOSIS — I10 Essential (primary) hypertension: Secondary | ICD-10-CM | POA: Diagnosis not present

## 2017-09-08 DIAGNOSIS — E119 Type 2 diabetes mellitus without complications: Secondary | ICD-10-CM | POA: Diagnosis not present

## 2017-09-08 DIAGNOSIS — E1151 Type 2 diabetes mellitus with diabetic peripheral angiopathy without gangrene: Secondary | ICD-10-CM | POA: Diagnosis not present

## 2017-09-14 DIAGNOSIS — Z0000 Encounter for general adult medical examination without abnormal findings: Secondary | ICD-10-CM | POA: Diagnosis not present

## 2017-09-14 DIAGNOSIS — Z1389 Encounter for screening for other disorder: Secondary | ICD-10-CM | POA: Diagnosis not present

## 2017-09-14 DIAGNOSIS — Z6830 Body mass index (BMI) 30.0-30.9, adult: Secondary | ICD-10-CM | POA: Diagnosis not present

## 2017-09-14 DIAGNOSIS — E1151 Type 2 diabetes mellitus with diabetic peripheral angiopathy without gangrene: Secondary | ICD-10-CM | POA: Diagnosis not present

## 2017-09-14 DIAGNOSIS — E669 Obesity, unspecified: Secondary | ICD-10-CM | POA: Diagnosis not present

## 2017-09-14 DIAGNOSIS — M1990 Unspecified osteoarthritis, unspecified site: Secondary | ICD-10-CM | POA: Diagnosis not present

## 2017-09-14 DIAGNOSIS — E785 Hyperlipidemia, unspecified: Secondary | ICD-10-CM | POA: Diagnosis not present

## 2017-09-14 DIAGNOSIS — R002 Palpitations: Secondary | ICD-10-CM | POA: Diagnosis not present

## 2017-09-14 DIAGNOSIS — I25119 Atherosclerotic heart disease of native coronary artery with unspecified angina pectoris: Secondary | ICD-10-CM | POA: Diagnosis not present

## 2017-09-14 DIAGNOSIS — C61 Malignant neoplasm of prostate: Secondary | ICD-10-CM | POA: Diagnosis not present

## 2017-09-14 DIAGNOSIS — I10 Essential (primary) hypertension: Secondary | ICD-10-CM | POA: Diagnosis not present

## 2017-09-23 DIAGNOSIS — Z1212 Encounter for screening for malignant neoplasm of rectum: Secondary | ICD-10-CM | POA: Diagnosis not present

## 2018-01-27 DIAGNOSIS — Z23 Encounter for immunization: Secondary | ICD-10-CM | POA: Diagnosis not present

## 2018-01-27 DIAGNOSIS — E1151 Type 2 diabetes mellitus with diabetic peripheral angiopathy without gangrene: Secondary | ICD-10-CM | POA: Diagnosis not present

## 2018-01-27 DIAGNOSIS — I25119 Atherosclerotic heart disease of native coronary artery with unspecified angina pectoris: Secondary | ICD-10-CM | POA: Diagnosis not present

## 2018-01-27 DIAGNOSIS — Z6830 Body mass index (BMI) 30.0-30.9, adult: Secondary | ICD-10-CM | POA: Diagnosis not present

## 2018-01-27 DIAGNOSIS — E7849 Other hyperlipidemia: Secondary | ICD-10-CM | POA: Diagnosis not present

## 2018-01-27 DIAGNOSIS — E668 Other obesity: Secondary | ICD-10-CM | POA: Diagnosis not present

## 2018-01-27 DIAGNOSIS — I10 Essential (primary) hypertension: Secondary | ICD-10-CM | POA: Diagnosis not present

## 2018-05-04 DIAGNOSIS — B351 Tinea unguium: Secondary | ICD-10-CM | POA: Diagnosis not present

## 2018-05-04 DIAGNOSIS — D1801 Hemangioma of skin and subcutaneous tissue: Secondary | ICD-10-CM | POA: Diagnosis not present

## 2018-05-04 DIAGNOSIS — Z85828 Personal history of other malignant neoplasm of skin: Secondary | ICD-10-CM | POA: Diagnosis not present

## 2018-05-04 DIAGNOSIS — D225 Melanocytic nevi of trunk: Secondary | ICD-10-CM | POA: Diagnosis not present

## 2018-05-04 DIAGNOSIS — D2261 Melanocytic nevi of right upper limb, including shoulder: Secondary | ICD-10-CM | POA: Diagnosis not present

## 2018-05-04 DIAGNOSIS — D2271 Melanocytic nevi of right lower limb, including hip: Secondary | ICD-10-CM | POA: Diagnosis not present

## 2018-05-04 DIAGNOSIS — L814 Other melanin hyperpigmentation: Secondary | ICD-10-CM | POA: Diagnosis not present

## 2018-05-04 DIAGNOSIS — D485 Neoplasm of uncertain behavior of skin: Secondary | ICD-10-CM | POA: Diagnosis not present

## 2018-05-04 DIAGNOSIS — L821 Other seborrheic keratosis: Secondary | ICD-10-CM | POA: Diagnosis not present

## 2018-05-04 DIAGNOSIS — D2262 Melanocytic nevi of left upper limb, including shoulder: Secondary | ICD-10-CM | POA: Diagnosis not present

## 2018-05-04 DIAGNOSIS — D2272 Melanocytic nevi of left lower limb, including hip: Secondary | ICD-10-CM | POA: Diagnosis not present

## 2018-06-13 DIAGNOSIS — E1151 Type 2 diabetes mellitus with diabetic peripheral angiopathy without gangrene: Secondary | ICD-10-CM | POA: Diagnosis not present

## 2018-06-13 DIAGNOSIS — M545 Low back pain: Secondary | ICD-10-CM | POA: Diagnosis not present

## 2018-06-13 DIAGNOSIS — Z6831 Body mass index (BMI) 31.0-31.9, adult: Secondary | ICD-10-CM | POA: Diagnosis not present

## 2018-06-13 DIAGNOSIS — E7849 Other hyperlipidemia: Secondary | ICD-10-CM | POA: Diagnosis not present

## 2018-06-13 DIAGNOSIS — E668 Other obesity: Secondary | ICD-10-CM | POA: Diagnosis not present

## 2018-06-13 DIAGNOSIS — I25119 Atherosclerotic heart disease of native coronary artery with unspecified angina pectoris: Secondary | ICD-10-CM | POA: Diagnosis not present

## 2018-06-13 DIAGNOSIS — I10 Essential (primary) hypertension: Secondary | ICD-10-CM | POA: Diagnosis not present

## 2018-06-22 DIAGNOSIS — C61 Malignant neoplasm of prostate: Secondary | ICD-10-CM | POA: Diagnosis not present

## 2018-06-29 DIAGNOSIS — Z8546 Personal history of malignant neoplasm of prostate: Secondary | ICD-10-CM | POA: Diagnosis not present

## 2018-09-11 DIAGNOSIS — R82998 Other abnormal findings in urine: Secondary | ICD-10-CM | POA: Diagnosis not present

## 2018-09-11 DIAGNOSIS — E7849 Other hyperlipidemia: Secondary | ICD-10-CM | POA: Diagnosis not present

## 2018-09-11 DIAGNOSIS — Z125 Encounter for screening for malignant neoplasm of prostate: Secondary | ICD-10-CM | POA: Diagnosis not present

## 2018-09-11 DIAGNOSIS — I10 Essential (primary) hypertension: Secondary | ICD-10-CM | POA: Diagnosis not present

## 2018-09-11 DIAGNOSIS — E1151 Type 2 diabetes mellitus with diabetic peripheral angiopathy without gangrene: Secondary | ICD-10-CM | POA: Diagnosis not present

## 2018-09-18 DIAGNOSIS — I25119 Atherosclerotic heart disease of native coronary artery with unspecified angina pectoris: Secondary | ICD-10-CM | POA: Diagnosis not present

## 2018-09-18 DIAGNOSIS — Z1331 Encounter for screening for depression: Secondary | ICD-10-CM | POA: Diagnosis not present

## 2018-09-18 DIAGNOSIS — E1151 Type 2 diabetes mellitus with diabetic peripheral angiopathy without gangrene: Secondary | ICD-10-CM | POA: Diagnosis not present

## 2018-09-18 DIAGNOSIS — C61 Malignant neoplasm of prostate: Secondary | ICD-10-CM | POA: Diagnosis not present

## 2018-09-18 DIAGNOSIS — M1990 Unspecified osteoarthritis, unspecified site: Secondary | ICD-10-CM | POA: Diagnosis not present

## 2018-09-18 DIAGNOSIS — Z0000 Encounter for general adult medical examination without abnormal findings: Secondary | ICD-10-CM | POA: Diagnosis not present

## 2018-09-18 DIAGNOSIS — I10 Essential (primary) hypertension: Secondary | ICD-10-CM | POA: Diagnosis not present

## 2018-09-18 DIAGNOSIS — R002 Palpitations: Secondary | ICD-10-CM | POA: Diagnosis not present

## 2018-09-18 DIAGNOSIS — M545 Low back pain: Secondary | ICD-10-CM | POA: Diagnosis not present

## 2018-09-18 DIAGNOSIS — E785 Hyperlipidemia, unspecified: Secondary | ICD-10-CM | POA: Diagnosis not present

## 2018-09-18 DIAGNOSIS — E669 Obesity, unspecified: Secondary | ICD-10-CM | POA: Diagnosis not present

## 2019-01-17 DIAGNOSIS — Z23 Encounter for immunization: Secondary | ICD-10-CM | POA: Diagnosis not present

## 2019-01-17 DIAGNOSIS — E1151 Type 2 diabetes mellitus with diabetic peripheral angiopathy without gangrene: Secondary | ICD-10-CM | POA: Diagnosis not present

## 2019-01-23 DIAGNOSIS — I10 Essential (primary) hypertension: Secondary | ICD-10-CM | POA: Diagnosis not present

## 2019-01-23 DIAGNOSIS — E1151 Type 2 diabetes mellitus with diabetic peripheral angiopathy without gangrene: Secondary | ICD-10-CM | POA: Diagnosis not present

## 2019-01-23 DIAGNOSIS — C61 Malignant neoplasm of prostate: Secondary | ICD-10-CM | POA: Diagnosis not present

## 2019-01-23 DIAGNOSIS — E669 Obesity, unspecified: Secondary | ICD-10-CM | POA: Diagnosis not present

## 2019-01-23 DIAGNOSIS — I25119 Atherosclerotic heart disease of native coronary artery with unspecified angina pectoris: Secondary | ICD-10-CM | POA: Diagnosis not present

## 2019-04-09 DIAGNOSIS — E119 Type 2 diabetes mellitus without complications: Secondary | ICD-10-CM | POA: Diagnosis not present

## 2019-06-01 DIAGNOSIS — L814 Other melanin hyperpigmentation: Secondary | ICD-10-CM | POA: Diagnosis not present

## 2019-06-01 DIAGNOSIS — D2262 Melanocytic nevi of left upper limb, including shoulder: Secondary | ICD-10-CM | POA: Diagnosis not present

## 2019-06-01 DIAGNOSIS — D225 Melanocytic nevi of trunk: Secondary | ICD-10-CM | POA: Diagnosis not present

## 2019-06-01 DIAGNOSIS — D1801 Hemangioma of skin and subcutaneous tissue: Secondary | ICD-10-CM | POA: Diagnosis not present

## 2019-06-01 DIAGNOSIS — Z85828 Personal history of other malignant neoplasm of skin: Secondary | ICD-10-CM | POA: Diagnosis not present

## 2019-06-01 DIAGNOSIS — B351 Tinea unguium: Secondary | ICD-10-CM | POA: Diagnosis not present

## 2019-06-01 DIAGNOSIS — L821 Other seborrheic keratosis: Secondary | ICD-10-CM | POA: Diagnosis not present

## 2019-06-01 DIAGNOSIS — D2261 Melanocytic nevi of right upper limb, including shoulder: Secondary | ICD-10-CM | POA: Diagnosis not present

## 2019-06-14 DIAGNOSIS — E1151 Type 2 diabetes mellitus with diabetic peripheral angiopathy without gangrene: Secondary | ICD-10-CM | POA: Diagnosis not present

## 2019-06-19 DIAGNOSIS — I25119 Atherosclerotic heart disease of native coronary artery with unspecified angina pectoris: Secondary | ICD-10-CM | POA: Diagnosis not present

## 2019-06-19 DIAGNOSIS — M1990 Unspecified osteoarthritis, unspecified site: Secondary | ICD-10-CM | POA: Diagnosis not present

## 2019-06-19 DIAGNOSIS — E669 Obesity, unspecified: Secondary | ICD-10-CM | POA: Diagnosis not present

## 2019-06-19 DIAGNOSIS — F439 Reaction to severe stress, unspecified: Secondary | ICD-10-CM | POA: Diagnosis not present

## 2019-06-19 DIAGNOSIS — I10 Essential (primary) hypertension: Secondary | ICD-10-CM | POA: Diagnosis not present

## 2019-06-19 DIAGNOSIS — E1151 Type 2 diabetes mellitus with diabetic peripheral angiopathy without gangrene: Secondary | ICD-10-CM | POA: Diagnosis not present

## 2019-06-19 DIAGNOSIS — E785 Hyperlipidemia, unspecified: Secondary | ICD-10-CM | POA: Diagnosis not present

## 2019-06-21 ENCOUNTER — Ambulatory Visit: Payer: Medicare Other

## 2019-06-25 DIAGNOSIS — Z8546 Personal history of malignant neoplasm of prostate: Secondary | ICD-10-CM | POA: Diagnosis not present

## 2019-06-29 DIAGNOSIS — Z8546 Personal history of malignant neoplasm of prostate: Secondary | ICD-10-CM | POA: Diagnosis not present

## 2019-06-29 DIAGNOSIS — N400 Enlarged prostate without lower urinary tract symptoms: Secondary | ICD-10-CM | POA: Diagnosis not present

## 2019-06-30 ENCOUNTER — Ambulatory Visit: Payer: Medicare Other | Attending: Internal Medicine

## 2019-06-30 DIAGNOSIS — Z23 Encounter for immunization: Secondary | ICD-10-CM | POA: Insufficient documentation

## 2019-06-30 NOTE — Progress Notes (Signed)
   Covid-19 Vaccination Clinic  Name:  Christopher Barnett    MRN: [id number] DOB: 07/17/1945  06/30/2019  Mr. Christopher Barnett was observed post Covid-19 immunization for 15 minutes without incidence. He was provided with Vaccine Information Sheet and instruction to access the V-Safe system.   Mr. Christopher Barnett was instructed to call 911 with any severe reactions post vaccine: Marland Kitchen Difficulty breathing  . Swelling of your face and throat  . A fast heartbeat  . A bad rash all over your body  . Dizziness and weakness    Immunizations Administered    Name Date Dose VIS Date Route   Pfizer COVID-19 Vaccine 06/30/2019 12:43 PM 0.3 mL 05/04/2019 Intramuscular   Manufacturer: [place]   Lot: [id number]   [place]: [id number]

## 2019-07-02 ENCOUNTER — Ambulatory Visit: Payer: Medicare Other

## 2019-07-25 ENCOUNTER — Ambulatory Visit: Payer: Medicare Other | Attending: Internal Medicine

## 2019-07-25 DIAGNOSIS — Z23 Encounter for immunization: Secondary | ICD-10-CM | POA: Insufficient documentation

## 2019-07-25 NOTE — Progress Notes (Signed)
   Covid-19 Vaccination Clinic  Name:  Christopher Barnett    MRN: [id number] DOB: [birth year]  07/25/2019  [name] was observed post Covid-19 immunization for 15 minutes without incident. He was provided with Vaccine Information Sheet and instruction to access the V-Safe system.   [name] was instructed to call 911 with any severe reactions post vaccine: Marland Kitchen Difficulty breathing  . Swelling of face and throat  . A fast heartbeat  . A bad rash all over body  . Dizziness and weakness   Immunizations Administered    Name Date Dose VIS Date Route   Pfizer COVID-19 Vaccine 07/25/2019  9:28 AM 0.3 mL 05/04/2019 Intramuscular   Manufacturer: [place]   Lot: [id number]   [place]: [id number]

## 2019-09-19 DIAGNOSIS — E1151 Type 2 diabetes mellitus with diabetic peripheral angiopathy without gangrene: Secondary | ICD-10-CM | POA: Diagnosis not present

## 2019-09-19 DIAGNOSIS — Z125 Encounter for screening for malignant neoplasm of prostate: Secondary | ICD-10-CM | POA: Diagnosis not present

## 2019-09-19 DIAGNOSIS — E7849 Other hyperlipidemia: Secondary | ICD-10-CM | POA: Diagnosis not present

## 2019-09-26 DIAGNOSIS — I25119 Atherosclerotic heart disease of native coronary artery with unspecified angina pectoris: Secondary | ICD-10-CM | POA: Diagnosis not present

## 2019-09-26 DIAGNOSIS — M1990 Unspecified osteoarthritis, unspecified site: Secondary | ICD-10-CM | POA: Diagnosis not present

## 2019-09-26 DIAGNOSIS — E1151 Type 2 diabetes mellitus with diabetic peripheral angiopathy without gangrene: Secondary | ICD-10-CM | POA: Diagnosis not present

## 2019-09-26 DIAGNOSIS — R82998 Other abnormal findings in urine: Secondary | ICD-10-CM | POA: Diagnosis not present

## 2019-09-26 DIAGNOSIS — C61 Malignant neoplasm of prostate: Secondary | ICD-10-CM | POA: Diagnosis not present

## 2019-09-26 DIAGNOSIS — E669 Obesity, unspecified: Secondary | ICD-10-CM | POA: Diagnosis not present

## 2019-09-26 DIAGNOSIS — F439 Reaction to severe stress, unspecified: Secondary | ICD-10-CM | POA: Diagnosis not present

## 2019-09-26 DIAGNOSIS — Z1331 Encounter for screening for depression: Secondary | ICD-10-CM | POA: Diagnosis not present

## 2019-09-26 DIAGNOSIS — E785 Hyperlipidemia, unspecified: Secondary | ICD-10-CM | POA: Diagnosis not present

## 2019-09-26 DIAGNOSIS — I10 Essential (primary) hypertension: Secondary | ICD-10-CM | POA: Diagnosis not present

## 2019-09-26 DIAGNOSIS — Z0000 Encounter for general adult medical examination without abnormal findings: Secondary | ICD-10-CM | POA: Diagnosis not present

## 2019-10-02 DIAGNOSIS — I10 Essential (primary) hypertension: Secondary | ICD-10-CM | POA: Diagnosis not present

## 2020-02-20 DIAGNOSIS — Z23 Encounter for immunization: Secondary | ICD-10-CM | POA: Diagnosis not present

## 2020-03-01 DIAGNOSIS — Z23 Encounter for immunization: Secondary | ICD-10-CM | POA: Diagnosis not present

## 2020-03-07 DIAGNOSIS — M1990 Unspecified osteoarthritis, unspecified site: Secondary | ICD-10-CM | POA: Diagnosis not present

## 2020-03-07 DIAGNOSIS — E785 Hyperlipidemia, unspecified: Secondary | ICD-10-CM | POA: Diagnosis not present

## 2020-03-07 DIAGNOSIS — E669 Obesity, unspecified: Secondary | ICD-10-CM | POA: Diagnosis not present

## 2020-03-07 DIAGNOSIS — I10 Essential (primary) hypertension: Secondary | ICD-10-CM | POA: Diagnosis not present

## 2020-03-07 DIAGNOSIS — E1151 Type 2 diabetes mellitus with diabetic peripheral angiopathy without gangrene: Secondary | ICD-10-CM | POA: Diagnosis not present

## 2020-03-07 DIAGNOSIS — C61 Malignant neoplasm of prostate: Secondary | ICD-10-CM | POA: Diagnosis not present

## 2020-03-07 DIAGNOSIS — I25119 Atherosclerotic heart disease of native coronary artery with unspecified angina pectoris: Secondary | ICD-10-CM | POA: Diagnosis not present

## 2020-04-25 DIAGNOSIS — Z1152 Encounter for screening for COVID-19: Secondary | ICD-10-CM | POA: Diagnosis not present

## 2020-06-27 DIAGNOSIS — Z8546 Personal history of malignant neoplasm of prostate: Secondary | ICD-10-CM | POA: Diagnosis not present

## 2020-07-04 DIAGNOSIS — Z8546 Personal history of malignant neoplasm of prostate: Secondary | ICD-10-CM | POA: Diagnosis not present

## 2020-07-08 DIAGNOSIS — I10 Essential (primary) hypertension: Secondary | ICD-10-CM | POA: Diagnosis not present

## 2020-07-08 DIAGNOSIS — I25119 Atherosclerotic heart disease of native coronary artery with unspecified angina pectoris: Secondary | ICD-10-CM | POA: Diagnosis not present

## 2020-07-08 DIAGNOSIS — M1990 Unspecified osteoarthritis, unspecified site: Secondary | ICD-10-CM | POA: Diagnosis not present

## 2020-07-08 DIAGNOSIS — C61 Malignant neoplasm of prostate: Secondary | ICD-10-CM | POA: Diagnosis not present

## 2020-07-08 DIAGNOSIS — E785 Hyperlipidemia, unspecified: Secondary | ICD-10-CM | POA: Diagnosis not present

## 2020-07-08 DIAGNOSIS — E669 Obesity, unspecified: Secondary | ICD-10-CM | POA: Diagnosis not present

## 2020-07-08 DIAGNOSIS — E1151 Type 2 diabetes mellitus with diabetic peripheral angiopathy without gangrene: Secondary | ICD-10-CM | POA: Diagnosis not present

## 2020-10-06 DIAGNOSIS — Z23 Encounter for immunization: Secondary | ICD-10-CM | POA: Diagnosis not present

## 2020-10-09 DIAGNOSIS — E785 Hyperlipidemia, unspecified: Secondary | ICD-10-CM | POA: Diagnosis not present

## 2020-10-09 DIAGNOSIS — E1151 Type 2 diabetes mellitus with diabetic peripheral angiopathy without gangrene: Secondary | ICD-10-CM | POA: Diagnosis not present

## 2020-10-09 DIAGNOSIS — Z125 Encounter for screening for malignant neoplasm of prostate: Secondary | ICD-10-CM | POA: Diagnosis not present

## 2020-10-15 DIAGNOSIS — H9190 Unspecified hearing loss, unspecified ear: Secondary | ICD-10-CM | POA: Diagnosis not present

## 2020-10-15 DIAGNOSIS — E785 Hyperlipidemia, unspecified: Secondary | ICD-10-CM | POA: Diagnosis not present

## 2020-10-15 DIAGNOSIS — M1990 Unspecified osteoarthritis, unspecified site: Secondary | ICD-10-CM | POA: Diagnosis not present

## 2020-10-15 DIAGNOSIS — R82998 Other abnormal findings in urine: Secondary | ICD-10-CM | POA: Diagnosis not present

## 2020-10-15 DIAGNOSIS — Z1389 Encounter for screening for other disorder: Secondary | ICD-10-CM | POA: Diagnosis not present

## 2020-10-15 DIAGNOSIS — Z1212 Encounter for screening for malignant neoplasm of rectum: Secondary | ICD-10-CM | POA: Diagnosis not present

## 2020-10-15 DIAGNOSIS — E669 Obesity, unspecified: Secondary | ICD-10-CM | POA: Diagnosis not present

## 2020-10-15 DIAGNOSIS — I25119 Atherosclerotic heart disease of native coronary artery with unspecified angina pectoris: Secondary | ICD-10-CM | POA: Diagnosis not present

## 2020-10-15 DIAGNOSIS — C61 Malignant neoplasm of prostate: Secondary | ICD-10-CM | POA: Diagnosis not present

## 2020-10-15 DIAGNOSIS — Z1331 Encounter for screening for depression: Secondary | ICD-10-CM | POA: Diagnosis not present

## 2020-10-15 DIAGNOSIS — I10 Essential (primary) hypertension: Secondary | ICD-10-CM | POA: Diagnosis not present

## 2020-10-15 DIAGNOSIS — Z0000 Encounter for general adult medical examination without abnormal findings: Secondary | ICD-10-CM | POA: Diagnosis not present

## 2020-10-15 DIAGNOSIS — E1151 Type 2 diabetes mellitus with diabetic peripheral angiopathy without gangrene: Secondary | ICD-10-CM | POA: Diagnosis not present

## 2021-02-25 DIAGNOSIS — E669 Obesity, unspecified: Secondary | ICD-10-CM | POA: Diagnosis not present

## 2021-02-25 DIAGNOSIS — E1151 Type 2 diabetes mellitus with diabetic peripheral angiopathy without gangrene: Secondary | ICD-10-CM | POA: Diagnosis not present

## 2021-02-25 DIAGNOSIS — I25119 Atherosclerotic heart disease of native coronary artery with unspecified angina pectoris: Secondary | ICD-10-CM | POA: Diagnosis not present

## 2021-02-25 DIAGNOSIS — E785 Hyperlipidemia, unspecified: Secondary | ICD-10-CM | POA: Diagnosis not present

## 2021-02-25 DIAGNOSIS — Z8546 Personal history of malignant neoplasm of prostate: Secondary | ICD-10-CM | POA: Diagnosis not present

## 2021-02-25 DIAGNOSIS — M1990 Unspecified osteoarthritis, unspecified site: Secondary | ICD-10-CM | POA: Diagnosis not present

## 2021-02-25 DIAGNOSIS — I10 Essential (primary) hypertension: Secondary | ICD-10-CM | POA: Diagnosis not present

## 2021-02-25 DIAGNOSIS — H9190 Unspecified hearing loss, unspecified ear: Secondary | ICD-10-CM | POA: Diagnosis not present

## 2021-03-05 DIAGNOSIS — Z23 Encounter for immunization: Secondary | ICD-10-CM | POA: Diagnosis not present

## 2021-03-16 DIAGNOSIS — Z23 Encounter for immunization: Secondary | ICD-10-CM | POA: Diagnosis not present

## 2021-05-26 DIAGNOSIS — Z20822 Contact with and (suspected) exposure to covid-19: Secondary | ICD-10-CM | POA: Diagnosis not present

## 2021-06-10 DIAGNOSIS — D1801 Hemangioma of skin and subcutaneous tissue: Secondary | ICD-10-CM | POA: Diagnosis not present

## 2021-06-10 DIAGNOSIS — L821 Other seborrheic keratosis: Secondary | ICD-10-CM | POA: Diagnosis not present

## 2021-06-10 DIAGNOSIS — D225 Melanocytic nevi of trunk: Secondary | ICD-10-CM | POA: Diagnosis not present

## 2021-06-10 DIAGNOSIS — D2261 Melanocytic nevi of right upper limb, including shoulder: Secondary | ICD-10-CM | POA: Diagnosis not present

## 2021-06-10 DIAGNOSIS — D2262 Melanocytic nevi of left upper limb, including shoulder: Secondary | ICD-10-CM | POA: Diagnosis not present

## 2021-06-10 DIAGNOSIS — Z85828 Personal history of other malignant neoplasm of skin: Secondary | ICD-10-CM | POA: Diagnosis not present

## 2021-06-10 DIAGNOSIS — L814 Other melanin hyperpigmentation: Secondary | ICD-10-CM | POA: Diagnosis not present

## 2021-06-29 DIAGNOSIS — I10 Essential (primary) hypertension: Secondary | ICD-10-CM | POA: Diagnosis not present

## 2021-06-29 DIAGNOSIS — I25119 Atherosclerotic heart disease of native coronary artery with unspecified angina pectoris: Secondary | ICD-10-CM | POA: Diagnosis not present

## 2021-06-29 DIAGNOSIS — H9190 Unspecified hearing loss, unspecified ear: Secondary | ICD-10-CM | POA: Diagnosis not present

## 2021-06-29 DIAGNOSIS — E1151 Type 2 diabetes mellitus with diabetic peripheral angiopathy without gangrene: Secondary | ICD-10-CM | POA: Diagnosis not present

## 2021-06-29 DIAGNOSIS — E785 Hyperlipidemia, unspecified: Secondary | ICD-10-CM | POA: Diagnosis not present

## 2021-06-29 DIAGNOSIS — E669 Obesity, unspecified: Secondary | ICD-10-CM | POA: Diagnosis not present

## 2021-06-29 DIAGNOSIS — Z8546 Personal history of malignant neoplasm of prostate: Secondary | ICD-10-CM | POA: Diagnosis not present

## 2021-06-29 DIAGNOSIS — M1990 Unspecified osteoarthritis, unspecified site: Secondary | ICD-10-CM | POA: Diagnosis not present

## 2021-09-15 DIAGNOSIS — Z20822 Contact with and (suspected) exposure to covid-19: Secondary | ICD-10-CM | POA: Diagnosis not present

## 2021-09-27 DIAGNOSIS — Z20822 Contact with and (suspected) exposure to covid-19: Secondary | ICD-10-CM | POA: Diagnosis not present

## 2021-12-03 DIAGNOSIS — I10 Essential (primary) hypertension: Secondary | ICD-10-CM | POA: Diagnosis not present

## 2021-12-03 DIAGNOSIS — E785 Hyperlipidemia, unspecified: Secondary | ICD-10-CM | POA: Diagnosis not present

## 2021-12-03 DIAGNOSIS — R7989 Other specified abnormal findings of blood chemistry: Secondary | ICD-10-CM | POA: Diagnosis not present

## 2021-12-03 DIAGNOSIS — R739 Hyperglycemia, unspecified: Secondary | ICD-10-CM | POA: Diagnosis not present

## 2021-12-03 DIAGNOSIS — Z125 Encounter for screening for malignant neoplasm of prostate: Secondary | ICD-10-CM | POA: Diagnosis not present

## 2021-12-10 DIAGNOSIS — Z1212 Encounter for screening for malignant neoplasm of rectum: Secondary | ICD-10-CM | POA: Diagnosis not present

## 2021-12-10 DIAGNOSIS — Z0000 Encounter for general adult medical examination without abnormal findings: Secondary | ICD-10-CM | POA: Diagnosis not present

## 2021-12-15 DIAGNOSIS — I10 Essential (primary) hypertension: Secondary | ICD-10-CM | POA: Diagnosis not present

## 2021-12-15 DIAGNOSIS — I25119 Atherosclerotic heart disease of native coronary artery with unspecified angina pectoris: Secondary | ICD-10-CM | POA: Diagnosis not present

## 2021-12-15 DIAGNOSIS — Z1389 Encounter for screening for other disorder: Secondary | ICD-10-CM | POA: Diagnosis not present

## 2021-12-15 DIAGNOSIS — E785 Hyperlipidemia, unspecified: Secondary | ICD-10-CM | POA: Diagnosis not present

## 2021-12-15 DIAGNOSIS — M1990 Unspecified osteoarthritis, unspecified site: Secondary | ICD-10-CM | POA: Diagnosis not present

## 2021-12-15 DIAGNOSIS — E669 Obesity, unspecified: Secondary | ICD-10-CM | POA: Diagnosis not present

## 2021-12-15 DIAGNOSIS — Z0000 Encounter for general adult medical examination without abnormal findings: Secondary | ICD-10-CM | POA: Diagnosis not present

## 2021-12-15 DIAGNOSIS — R82998 Other abnormal findings in urine: Secondary | ICD-10-CM | POA: Diagnosis not present

## 2021-12-15 DIAGNOSIS — H9190 Unspecified hearing loss, unspecified ear: Secondary | ICD-10-CM | POA: Diagnosis not present

## 2021-12-15 DIAGNOSIS — C61 Malignant neoplasm of prostate: Secondary | ICD-10-CM | POA: Diagnosis not present

## 2021-12-15 DIAGNOSIS — Z1331 Encounter for screening for depression: Secondary | ICD-10-CM | POA: Diagnosis not present

## 2021-12-15 DIAGNOSIS — E1151 Type 2 diabetes mellitus with diabetic peripheral angiopathy without gangrene: Secondary | ICD-10-CM | POA: Diagnosis not present

## 2022-01-13 DIAGNOSIS — R1031 Right lower quadrant pain: Secondary | ICD-10-CM | POA: Diagnosis not present

## 2022-01-13 DIAGNOSIS — M545 Low back pain, unspecified: Secondary | ICD-10-CM | POA: Diagnosis not present

## 2022-01-22 DIAGNOSIS — E119 Type 2 diabetes mellitus without complications: Secondary | ICD-10-CM | POA: Diagnosis not present

## 2022-02-27 DIAGNOSIS — Z23 Encounter for immunization: Secondary | ICD-10-CM | POA: Diagnosis not present

## 2022-03-17 DIAGNOSIS — Z23 Encounter for immunization: Secondary | ICD-10-CM | POA: Diagnosis not present

## 2022-04-27 DIAGNOSIS — Z09 Encounter for follow-up examination after completed treatment for conditions other than malignant neoplasm: Secondary | ICD-10-CM | POA: Diagnosis not present

## 2022-04-27 DIAGNOSIS — K649 Unspecified hemorrhoids: Secondary | ICD-10-CM | POA: Diagnosis not present

## 2022-04-27 DIAGNOSIS — Z86010 Personal history of colonic polyps: Secondary | ICD-10-CM | POA: Diagnosis not present

## 2022-04-27 DIAGNOSIS — K5730 Diverticulosis of large intestine without perforation or abscess without bleeding: Secondary | ICD-10-CM | POA: Diagnosis not present

## 2022-04-28 DIAGNOSIS — E669 Obesity, unspecified: Secondary | ICD-10-CM | POA: Diagnosis not present

## 2022-04-28 DIAGNOSIS — E1151 Type 2 diabetes mellitus with diabetic peripheral angiopathy without gangrene: Secondary | ICD-10-CM | POA: Diagnosis not present

## 2022-04-28 DIAGNOSIS — H9190 Unspecified hearing loss, unspecified ear: Secondary | ICD-10-CM | POA: Diagnosis not present

## 2022-04-28 DIAGNOSIS — I25119 Atherosclerotic heart disease of native coronary artery with unspecified angina pectoris: Secondary | ICD-10-CM | POA: Diagnosis not present

## 2022-04-28 DIAGNOSIS — M545 Low back pain, unspecified: Secondary | ICD-10-CM | POA: Diagnosis not present

## 2022-04-28 DIAGNOSIS — M1990 Unspecified osteoarthritis, unspecified site: Secondary | ICD-10-CM | POA: Diagnosis not present

## 2022-04-28 DIAGNOSIS — E785 Hyperlipidemia, unspecified: Secondary | ICD-10-CM | POA: Diagnosis not present

## 2022-04-28 DIAGNOSIS — I10 Essential (primary) hypertension: Secondary | ICD-10-CM | POA: Diagnosis not present

## 2022-04-28 DIAGNOSIS — C61 Malignant neoplasm of prostate: Secondary | ICD-10-CM | POA: Diagnosis not present

## 2022-07-05 DIAGNOSIS — D2262 Melanocytic nevi of left upper limb, including shoulder: Secondary | ICD-10-CM | POA: Diagnosis not present

## 2022-07-05 DIAGNOSIS — L821 Other seborrheic keratosis: Secondary | ICD-10-CM | POA: Diagnosis not present

## 2022-07-05 DIAGNOSIS — L814 Other melanin hyperpigmentation: Secondary | ICD-10-CM | POA: Diagnosis not present

## 2022-07-05 DIAGNOSIS — D1801 Hemangioma of skin and subcutaneous tissue: Secondary | ICD-10-CM | POA: Diagnosis not present

## 2022-07-05 DIAGNOSIS — D2261 Melanocytic nevi of right upper limb, including shoulder: Secondary | ICD-10-CM | POA: Diagnosis not present

## 2022-07-05 DIAGNOSIS — Z85828 Personal history of other malignant neoplasm of skin: Secondary | ICD-10-CM | POA: Diagnosis not present

## 2022-07-05 DIAGNOSIS — D225 Melanocytic nevi of trunk: Secondary | ICD-10-CM | POA: Diagnosis not present

## 2022-07-05 DIAGNOSIS — D2272 Melanocytic nevi of left lower limb, including hip: Secondary | ICD-10-CM | POA: Diagnosis not present

## 2022-08-30 DIAGNOSIS — I10 Essential (primary) hypertension: Secondary | ICD-10-CM | POA: Diagnosis not present

## 2022-08-30 DIAGNOSIS — H9190 Unspecified hearing loss, unspecified ear: Secondary | ICD-10-CM | POA: Diagnosis not present

## 2022-08-30 DIAGNOSIS — I25119 Atherosclerotic heart disease of native coronary artery with unspecified angina pectoris: Secondary | ICD-10-CM | POA: Diagnosis not present

## 2022-08-30 DIAGNOSIS — E1151 Type 2 diabetes mellitus with diabetic peripheral angiopathy without gangrene: Secondary | ICD-10-CM | POA: Diagnosis not present

## 2022-08-30 DIAGNOSIS — E785 Hyperlipidemia, unspecified: Secondary | ICD-10-CM | POA: Diagnosis not present

## 2022-08-30 DIAGNOSIS — C61 Malignant neoplasm of prostate: Secondary | ICD-10-CM | POA: Diagnosis not present

## 2022-08-30 DIAGNOSIS — J309 Allergic rhinitis, unspecified: Secondary | ICD-10-CM | POA: Diagnosis not present

## 2022-08-30 DIAGNOSIS — M1990 Unspecified osteoarthritis, unspecified site: Secondary | ICD-10-CM | POA: Diagnosis not present

## 2022-08-30 DIAGNOSIS — E669 Obesity, unspecified: Secondary | ICD-10-CM | POA: Diagnosis not present

## 2022-12-16 DIAGNOSIS — M1712 Unilateral primary osteoarthritis, left knee: Secondary | ICD-10-CM | POA: Diagnosis not present

## 2023-01-18 DIAGNOSIS — R739 Hyperglycemia, unspecified: Secondary | ICD-10-CM | POA: Diagnosis not present

## 2023-01-18 DIAGNOSIS — C61 Malignant neoplasm of prostate: Secondary | ICD-10-CM | POA: Diagnosis not present

## 2023-01-18 DIAGNOSIS — I10 Essential (primary) hypertension: Secondary | ICD-10-CM | POA: Diagnosis not present

## 2023-01-18 DIAGNOSIS — R7989 Other specified abnormal findings of blood chemistry: Secondary | ICD-10-CM | POA: Diagnosis not present

## 2023-01-18 DIAGNOSIS — E785 Hyperlipidemia, unspecified: Secondary | ICD-10-CM | POA: Diagnosis not present

## 2023-01-18 DIAGNOSIS — Z1212 Encounter for screening for malignant neoplasm of rectum: Secondary | ICD-10-CM | POA: Diagnosis not present

## 2023-01-25 DIAGNOSIS — I25119 Atherosclerotic heart disease of native coronary artery with unspecified angina pectoris: Secondary | ICD-10-CM | POA: Diagnosis not present

## 2023-01-25 DIAGNOSIS — Z0000 Encounter for general adult medical examination without abnormal findings: Secondary | ICD-10-CM | POA: Diagnosis not present

## 2023-01-25 DIAGNOSIS — J309 Allergic rhinitis, unspecified: Secondary | ICD-10-CM | POA: Diagnosis not present

## 2023-01-25 DIAGNOSIS — Z1331 Encounter for screening for depression: Secondary | ICD-10-CM | POA: Diagnosis not present

## 2023-01-25 DIAGNOSIS — R82998 Other abnormal findings in urine: Secondary | ICD-10-CM | POA: Diagnosis not present

## 2023-01-25 DIAGNOSIS — E669 Obesity, unspecified: Secondary | ICD-10-CM | POA: Diagnosis not present

## 2023-01-25 DIAGNOSIS — E1151 Type 2 diabetes mellitus with diabetic peripheral angiopathy without gangrene: Secondary | ICD-10-CM | POA: Diagnosis not present

## 2023-01-25 DIAGNOSIS — M1990 Unspecified osteoarthritis, unspecified site: Secondary | ICD-10-CM | POA: Diagnosis not present

## 2023-01-25 DIAGNOSIS — E785 Hyperlipidemia, unspecified: Secondary | ICD-10-CM | POA: Diagnosis not present

## 2023-01-25 DIAGNOSIS — Z1339 Encounter for screening examination for other mental health and behavioral disorders: Secondary | ICD-10-CM | POA: Diagnosis not present

## 2023-01-25 DIAGNOSIS — I10 Essential (primary) hypertension: Secondary | ICD-10-CM | POA: Diagnosis not present

## 2023-01-25 DIAGNOSIS — H9190 Unspecified hearing loss, unspecified ear: Secondary | ICD-10-CM | POA: Diagnosis not present

## 2023-01-25 DIAGNOSIS — C61 Malignant neoplasm of prostate: Secondary | ICD-10-CM | POA: Diagnosis not present

## 2023-01-25 DIAGNOSIS — Z23 Encounter for immunization: Secondary | ICD-10-CM | POA: Diagnosis not present

## 2023-02-23 DIAGNOSIS — Z23 Encounter for immunization: Secondary | ICD-10-CM | POA: Diagnosis not present

## 2023-06-03 DIAGNOSIS — I10 Essential (primary) hypertension: Secondary | ICD-10-CM | POA: Diagnosis not present

## 2023-06-03 DIAGNOSIS — E785 Hyperlipidemia, unspecified: Secondary | ICD-10-CM | POA: Diagnosis not present

## 2023-06-03 DIAGNOSIS — C61 Malignant neoplasm of prostate: Secondary | ICD-10-CM | POA: Diagnosis not present

## 2023-06-03 DIAGNOSIS — M545 Low back pain, unspecified: Secondary | ICD-10-CM | POA: Diagnosis not present

## 2023-06-03 DIAGNOSIS — E1151 Type 2 diabetes mellitus with diabetic peripheral angiopathy without gangrene: Secondary | ICD-10-CM | POA: Diagnosis not present

## 2023-06-03 DIAGNOSIS — I25119 Atherosclerotic heart disease of native coronary artery with unspecified angina pectoris: Secondary | ICD-10-CM | POA: Diagnosis not present

## 2023-06-03 DIAGNOSIS — H9190 Unspecified hearing loss, unspecified ear: Secondary | ICD-10-CM | POA: Diagnosis not present

## 2023-06-03 DIAGNOSIS — J309 Allergic rhinitis, unspecified: Secondary | ICD-10-CM | POA: Diagnosis not present

## 2023-06-03 DIAGNOSIS — M1990 Unspecified osteoarthritis, unspecified site: Secondary | ICD-10-CM | POA: Diagnosis not present

## 2023-06-03 DIAGNOSIS — E669 Obesity, unspecified: Secondary | ICD-10-CM | POA: Diagnosis not present

## 2023-08-10 DIAGNOSIS — Z85828 Personal history of other malignant neoplasm of skin: Secondary | ICD-10-CM | POA: Diagnosis not present

## 2023-08-10 DIAGNOSIS — D1801 Hemangioma of skin and subcutaneous tissue: Secondary | ICD-10-CM | POA: Diagnosis not present

## 2023-08-10 DIAGNOSIS — L821 Other seborrheic keratosis: Secondary | ICD-10-CM | POA: Diagnosis not present

## 2023-08-10 DIAGNOSIS — D225 Melanocytic nevi of trunk: Secondary | ICD-10-CM | POA: Diagnosis not present

## 2023-08-10 DIAGNOSIS — D2261 Melanocytic nevi of right upper limb, including shoulder: Secondary | ICD-10-CM | POA: Diagnosis not present

## 2023-08-10 DIAGNOSIS — L308 Other specified dermatitis: Secondary | ICD-10-CM | POA: Diagnosis not present

## 2023-08-10 DIAGNOSIS — D2262 Melanocytic nevi of left upper limb, including shoulder: Secondary | ICD-10-CM | POA: Diagnosis not present

## 2023-08-10 DIAGNOSIS — L720 Epidermal cyst: Secondary | ICD-10-CM | POA: Diagnosis not present

## 2023-09-30 DIAGNOSIS — M1990 Unspecified osteoarthritis, unspecified site: Secondary | ICD-10-CM | POA: Diagnosis not present

## 2023-09-30 DIAGNOSIS — C61 Malignant neoplasm of prostate: Secondary | ICD-10-CM | POA: Diagnosis not present

## 2023-09-30 DIAGNOSIS — M545 Low back pain, unspecified: Secondary | ICD-10-CM | POA: Diagnosis not present

## 2023-09-30 DIAGNOSIS — E1151 Type 2 diabetes mellitus with diabetic peripheral angiopathy without gangrene: Secondary | ICD-10-CM | POA: Diagnosis not present

## 2023-09-30 DIAGNOSIS — E669 Obesity, unspecified: Secondary | ICD-10-CM | POA: Diagnosis not present

## 2023-09-30 DIAGNOSIS — J309 Allergic rhinitis, unspecified: Secondary | ICD-10-CM | POA: Diagnosis not present

## 2023-09-30 DIAGNOSIS — H9190 Unspecified hearing loss, unspecified ear: Secondary | ICD-10-CM | POA: Diagnosis not present

## 2023-09-30 DIAGNOSIS — I10 Essential (primary) hypertension: Secondary | ICD-10-CM | POA: Diagnosis not present

## 2023-09-30 DIAGNOSIS — E785 Hyperlipidemia, unspecified: Secondary | ICD-10-CM | POA: Diagnosis not present

## 2023-09-30 DIAGNOSIS — I25119 Atherosclerotic heart disease of native coronary artery with unspecified angina pectoris: Secondary | ICD-10-CM | POA: Diagnosis not present

## 2023-09-30 DIAGNOSIS — R1310 Dysphagia, unspecified: Secondary | ICD-10-CM | POA: Diagnosis not present

## 2024-01-31 DIAGNOSIS — I10 Essential (primary) hypertension: Secondary | ICD-10-CM | POA: Diagnosis not present

## 2024-01-31 DIAGNOSIS — Z0189 Encounter for other specified special examinations: Secondary | ICD-10-CM | POA: Diagnosis not present

## 2024-01-31 DIAGNOSIS — E785 Hyperlipidemia, unspecified: Secondary | ICD-10-CM | POA: Diagnosis not present

## 2024-01-31 DIAGNOSIS — E1151 Type 2 diabetes mellitus with diabetic peripheral angiopathy without gangrene: Secondary | ICD-10-CM | POA: Diagnosis not present

## 2024-01-31 DIAGNOSIS — Z125 Encounter for screening for malignant neoplasm of prostate: Secondary | ICD-10-CM | POA: Diagnosis not present

## 2024-02-07 DIAGNOSIS — E669 Obesity, unspecified: Secondary | ICD-10-CM | POA: Diagnosis not present

## 2024-02-07 DIAGNOSIS — M545 Low back pain, unspecified: Secondary | ICD-10-CM | POA: Diagnosis not present

## 2024-02-07 DIAGNOSIS — E1151 Type 2 diabetes mellitus with diabetic peripheral angiopathy without gangrene: Secondary | ICD-10-CM | POA: Diagnosis not present

## 2024-02-07 DIAGNOSIS — C61 Malignant neoplasm of prostate: Secondary | ICD-10-CM | POA: Diagnosis not present

## 2024-02-07 DIAGNOSIS — Z0000 Encounter for general adult medical examination without abnormal findings: Secondary | ICD-10-CM | POA: Diagnosis not present

## 2024-02-07 DIAGNOSIS — Z1339 Encounter for screening examination for other mental health and behavioral disorders: Secondary | ICD-10-CM | POA: Diagnosis not present

## 2024-02-07 DIAGNOSIS — E785 Hyperlipidemia, unspecified: Secondary | ICD-10-CM | POA: Diagnosis not present

## 2024-02-07 DIAGNOSIS — M1990 Unspecified osteoarthritis, unspecified site: Secondary | ICD-10-CM | POA: Diagnosis not present

## 2024-02-07 DIAGNOSIS — Z1331 Encounter for screening for depression: Secondary | ICD-10-CM | POA: Diagnosis not present

## 2024-02-07 DIAGNOSIS — I25119 Atherosclerotic heart disease of native coronary artery with unspecified angina pectoris: Secondary | ICD-10-CM | POA: Diagnosis not present

## 2024-02-07 DIAGNOSIS — I10 Essential (primary) hypertension: Secondary | ICD-10-CM | POA: Diagnosis not present

## 2024-02-07 DIAGNOSIS — R82998 Other abnormal findings in urine: Secondary | ICD-10-CM | POA: Diagnosis not present

## 2024-02-07 DIAGNOSIS — Z23 Encounter for immunization: Secondary | ICD-10-CM | POA: Diagnosis not present

## 2024-02-07 DIAGNOSIS — H9190 Unspecified hearing loss, unspecified ear: Secondary | ICD-10-CM | POA: Diagnosis not present

## 2024-02-07 DIAGNOSIS — J309 Allergic rhinitis, unspecified: Secondary | ICD-10-CM | POA: Diagnosis not present

## 2024-06-12 ENCOUNTER — Other Ambulatory Visit: Payer: Self-pay | Admitting: Internal Medicine

## 2024-06-12 DIAGNOSIS — R1310 Dysphagia, unspecified: Secondary | ICD-10-CM

## 2024-06-21 ENCOUNTER — Ambulatory Visit
Admission: RE | Admit: 2024-06-21 | Discharge: 2024-06-21 | Disposition: A | Discharge status: Home / Self Care | Source: Ambulatory Visit | Attending: Internal Medicine | Admitting: Internal Medicine

## 2024-06-21 DIAGNOSIS — R1310 Dysphagia, unspecified: Secondary | ICD-10-CM
